# Patient Record
Sex: Female | Born: 1980 | Race: White | Hispanic: No | Marital: Married | Smoking: Never smoker
Health system: Southern US, Community
[De-identification: ages and names within clinical notes are randomized; demographics above are authoritative.]

## PROBLEM LIST (undated history)

## (undated) ENCOUNTER — Inpatient Hospital Stay (HOSPITAL_COMMUNITY): Payer: Self-pay

## (undated) DIAGNOSIS — O2243 Hemorrhoids in pregnancy, third trimester: Secondary | ICD-10-CM

## (undated) DIAGNOSIS — Z789 Other specified health status: Secondary | ICD-10-CM

## (undated) HISTORY — PX: NO PAST SURGERIES: SHX2092

## (undated) HISTORY — DX: Hemorrhoids in pregnancy, third trimester: O22.43

---

## 2015-12-23 ENCOUNTER — Inpatient Hospital Stay (HOSPITAL_COMMUNITY)
Admission: AD | Admit: 2015-12-23 | Discharge: 2015-12-23 | Disposition: A | Discharge status: Home / Self Care | Payer: Medicaid Other | Source: Ambulatory Visit | Attending: Obstetrics and Gynecology | Admitting: Obstetrics and Gynecology

## 2015-12-23 ENCOUNTER — Encounter (HOSPITAL_COMMUNITY): Payer: Self-pay | Admitting: *Deleted

## 2015-12-23 DIAGNOSIS — R109 Unspecified abdominal pain: Secondary | ICD-10-CM | POA: Diagnosis not present

## 2015-12-23 DIAGNOSIS — Z3A13 13 weeks gestation of pregnancy: Secondary | ICD-10-CM | POA: Diagnosis not present

## 2015-12-23 DIAGNOSIS — O219 Vomiting of pregnancy, unspecified: Secondary | ICD-10-CM

## 2015-12-23 DIAGNOSIS — O210 Mild hyperemesis gravidarum: Secondary | ICD-10-CM | POA: Insufficient documentation

## 2015-12-23 DIAGNOSIS — O9989 Other specified diseases and conditions complicating pregnancy, childbirth and the puerperium: Secondary | ICD-10-CM | POA: Diagnosis not present

## 2015-12-23 HISTORY — DX: Other specified health status: Z78.9

## 2015-12-23 LAB — COMPREHENSIVE METABOLIC PANEL
ALT: 20 U/L (ref 14–54)
ANION GAP: 5 (ref 5–15)
AST: 29 U/L (ref 15–41)
Albumin: 3.5 g/dL (ref 3.5–5.0)
Alkaline Phosphatase: 31 U/L — ABNORMAL LOW (ref 38–126)
BILIRUBIN TOTAL: 0.9 mg/dL (ref 0.3–1.2)
BUN: 7 mg/dL (ref 6–20)
CALCIUM: 9.4 mg/dL (ref 8.9–10.3)
CO2: 22 mmol/L (ref 22–32)
Chloride: 107 mmol/L (ref 101–111)
Creatinine, Ser: 0.44 mg/dL (ref 0.44–1.00)
GFR calc Af Amer: >60 mL/min (ref >60)
Glucose, Bld: 91 mg/dL (ref 65–99)
POTASSIUM: 5 mmol/L (ref 3.5–5.1)
Sodium: 134 mmol/L — ABNORMAL LOW (ref 135–145)
TOTAL PROTEIN: 6.6 g/dL (ref 6.5–8.1)

## 2015-12-23 LAB — URINALYSIS, ROUTINE W REFLEX MICROSCOPIC
Bilirubin Urine: NEGATIVE
Glucose, UA: NEGATIVE mg/dL
Ketones, ur: NEGATIVE mg/dL
LEUKOCYTES UA: NEGATIVE
NITRITE: NEGATIVE
PROTEIN: NEGATIVE mg/dL
Specific Gravity, Urine: 1.025 (ref 1.005–1.030)
pH: 6 (ref 5.0–8.0)

## 2015-12-23 LAB — CBC WITH DIFFERENTIAL/PLATELET
BASOS ABS: 0 10*3/uL (ref 0.0–0.1)
BASOS PCT: 0 %
EOS PCT: 0 %
Eosinophils Absolute: 0 10*3/uL (ref 0.0–0.7)
HCT: 31.4 % — ABNORMAL LOW (ref 36.0–46.0)
Hemoglobin: 10.9 g/dL — ABNORMAL LOW (ref 12.0–15.0)
LYMPHS PCT: 23 %
Lymphs Abs: 1.5 10*3/uL (ref 0.7–4.0)
MCH: 29.5 pg (ref 26.0–34.0)
MCHC: 34.7 g/dL (ref 30.0–36.0)
MCV: 85.1 fL (ref 78.0–100.0)
Monocytes Absolute: 0.3 10*3/uL (ref 0.1–1.0)
Monocytes Relative: 4 %
NEUTROS ABS: 4.7 10*3/uL (ref 1.7–7.7)
Neutrophils Relative %: 73 %
PLATELETS: 179 10*3/uL (ref 150–400)
RBC: 3.69 MIL/uL — AB (ref 3.87–5.11)
RDW: 12.4 % (ref 11.5–15.5)
WBC: 6.5 10*3/uL (ref 4.0–10.5)

## 2015-12-23 LAB — URINE MICROSCOPIC-ADD ON: BACTERIA UA: NONE SEEN

## 2015-12-23 MED ORDER — LACTATED RINGERS IV BOLUS (SEPSIS)
1000.0000 mL | Freq: Once | INTRAVENOUS | Status: AC
  Administered 2015-12-23: 1000 mL via INTRAVENOUS

## 2015-12-23 MED ORDER — ONDANSETRON 4 MG PO TBDP: 4.0000 mg | ORAL_TABLET | Freq: Four times a day (QID) | ORAL | 1 refills | Status: DC | PRN

## 2015-12-23 MED ORDER — PROMETHAZINE HCL 25 MG/ML IJ SOLN
12.5000 mg | Freq: Once | INTRAMUSCULAR | Status: AC
  Administered 2015-12-23: 12.5 mg via INTRAVENOUS
  Filled 2015-12-23: qty 1

## 2015-12-23 NOTE — Discharge Instructions (Signed)

## 2015-12-23 NOTE — MAU Note (Signed)
Pt C/O vomiting since beginning of pregnancy, is worse now.  Also LLQ pain for the last 3 days, denies bleeding.  No diarrhea.  Has HA.  Is traveling from [place].

## 2015-12-23 NOTE — MAU Provider Note (Signed)
  History     CSN: [id number]  Arrival date and time: 12/23/15 1109   First Provider Initiated Contact with Patient 12/23/15 1147      Chief Complaint  Patient presents with  . Emesis During Pregnancy  . Abdominal Pain   HPI 35 yo G2P0010 IUP 13 5/7 weeks presents to MAU with c/o N/V. Just moved to [place] from [place]. Has not started OB care here. IUP was confirmed in [place]. N/V has been a problem with this pregnancy. Had some Zofran which helped but Rx ran out about 1 week ago.   She denies any VB or LOF.   Denies any chronic medical problems.    Past Medical History:  Diagnosis Date  . Medical history non-contributory     Past Surgical History:  Procedure Laterality Date  . NO PAST SURGERIES      History reviewed. No pertinent family history.  Social History  Substance Use Topics  . Smoking status: Never Smoker  . Smokeless tobacco: Never Used  . Alcohol use No    Allergies: No Known Allergies  No prescriptions prior to admission.    Review of Systems  Constitutional: Negative.   Respiratory: Negative.   Cardiovascular: Negative.   Gastrointestinal: Positive for heartburn, nausea and vomiting.  Genitourinary: Negative.    Physical Exam   Blood pressure 117/67, pulse 91, temperature 98.1 F (36.7 C), temperature source Oral, resp. rate 16, height 5\' 4"  (1.626 m), weight 144 lb (65.3 kg), last menstrual period 09/18/2015.  Physical Exam WDWN female in NAD Lungs clear Heart RRR Abd soft + BS GU deferred Ext non tender MAU Course  Procedures IV fluids. + FHT by doppler   Assessment and Plan  IUP 13 5/7 weeks N/V  Pt was given IV fluids and antiemetics. Labs reviewed. Given po fluids and crackers. Tolerated. Felt amendable for d/c home. Rx for Zofran provided. Diet modification reviewed with pt. List of OB providers given to pt. Instructed to obtain OB care and return to MAU if Sx returned or for any concerns.  Hermina StaggersMichael L Brode Sculley 12/23/2015, 1:28  PM

## 2016-01-01 ENCOUNTER — Other Ambulatory Visit (HOSPITAL_COMMUNITY)
Admission: RE | Admit: 2016-01-01 | Discharge: 2016-01-01 | Disposition: A | Discharge status: Home / Self Care | Payer: Medicaid Other | Source: Ambulatory Visit | Attending: Family | Admitting: Family

## 2016-01-01 ENCOUNTER — Ambulatory Visit (INDEPENDENT_AMBULATORY_CARE_PROVIDER_SITE_OTHER): Payer: Medicaid Other | Admitting: Family

## 2016-01-01 ENCOUNTER — Encounter: Payer: Self-pay | Admitting: Family

## 2016-01-01 VITALS — BP 109/64 | HR 90 | Wt 146.0 lb

## 2016-01-01 DIAGNOSIS — Z113 Encounter for screening for infections with a predominantly sexual mode of transmission: Secondary | ICD-10-CM | POA: Insufficient documentation

## 2016-01-01 DIAGNOSIS — Z124 Encounter for screening for malignant neoplasm of cervix: Secondary | ICD-10-CM | POA: Diagnosis not present

## 2016-01-01 DIAGNOSIS — Z1151 Encounter for screening for human papillomavirus (HPV): Secondary | ICD-10-CM | POA: Diagnosis not present

## 2016-01-01 DIAGNOSIS — Z3491 Encounter for supervision of normal pregnancy, unspecified, first trimester: Secondary | ICD-10-CM

## 2016-01-01 DIAGNOSIS — Z01419 Encounter for gynecological examination (general) (routine) without abnormal findings: Secondary | ICD-10-CM | POA: Diagnosis present

## 2016-01-01 DIAGNOSIS — Z3480 Encounter for supervision of other normal pregnancy, unspecified trimester: Secondary | ICD-10-CM

## 2016-01-01 DIAGNOSIS — Z3482 Encounter for supervision of other normal pregnancy, second trimester: Secondary | ICD-10-CM | POA: Diagnosis not present

## 2016-01-01 DIAGNOSIS — Z3490 Encounter for supervision of normal pregnancy, unspecified, unspecified trimester: Secondary | ICD-10-CM | POA: Insufficient documentation

## 2016-01-01 LAB — HEMOGLOBIN A1C
Hgb A1c MFr Bld: 5 % (ref <5.7)
MEAN PLASMA GLUCOSE: 97 mg/dL

## 2016-01-01 LAB — GLUCOSE, RANDOM: GLUCOSE: 81 mg/dL (ref 65–99)

## 2016-01-01 MED ORDER — CONCEPT DHA 53.5-38-1 MG PO CAPS: 1.0000 | ORAL_CAPSULE | Freq: Every day | ORAL | 2 refills | Status: DC

## 2016-01-01 NOTE — Patient Instructions (Signed)
Second Trimester of Pregnancy The second trimester is from week 13 through week 28 (months 4 through 6). The second trimester is often a time when you feel your best. Your body has also adjusted to being pregnant, and you begin to feel better physically. Usually, morning sickness has lessened or quit completely, you may have more energy, and you may have an increase in appetite. The second trimester is also a time when the fetus is growing rapidly. At the end of the sixth month, the fetus is about 9 inches long and weighs about 1 pounds. You will likely begin to feel the baby move (quickening) between 18 and 20 weeks of the pregnancy. Body changes during your second trimester Your body continues to go through many changes during your second trimester. The changes vary from woman to woman.  Your weight will continue to increase. You will notice your lower abdomen bulging out.  You may begin to get stretch marks on your hips, abdomen, and breasts.  You may develop headaches that can be relieved by medicines. The medicines should be approved by your health care provider.  You may urinate more often because the fetus is pressing on your bladder.  You may develop or continue to have heartburn as a result of your pregnancy.  You may develop constipation because certain hormones are causing the muscles that push waste through your intestines to slow down.  You may develop hemorrhoids or swollen, bulging veins (varicose veins).  You may have back pain. This is caused by:  Weight gain.  Pregnancy hormones that are relaxing the joints in your pelvis.  A shift in weight and the muscles that support your balance.  Your breasts will continue to grow and they will continue to become tender.  Your gums may bleed and may be sensitive to brushing and flossing.  Dark spots or blotches (chloasma, mask of pregnancy) may develop on your face. This will likely fade after the baby is born.  A dark line  from your belly button to the pubic area (linea nigra) may appear. This will likely fade after the baby is born.  You may have changes in your hair. These can include thickening of your hair, rapid growth, and changes in texture. Some women also have hair loss during or after pregnancy, or hair that feels dry or thin. Your hair will most likely return to normal after your baby is born. What to expect at prenatal visits During a routine prenatal visit:  You will be weighed to make sure you and the fetus are growing normally.  Your blood pressure will be taken.  Your abdomen will be measured to track your baby's growth.  The fetal heartbeat will be listened to.  Any test results from the previous visit will be discussed. Your health care provider may ask you:  How you are feeling.  If you are feeling the baby move.  If you have had any abnormal symptoms, such as leaking fluid, bleeding, severe headaches, or abdominal cramping.  If you are using any tobacco products, including cigarettes, chewing tobacco, and electronic cigarettes.  If you have any questions. Other tests that may be performed during your second trimester include:  Blood tests that check for:  Low iron levels (anemia).  Gestational diabetes (between 24 and 28 weeks).  Rh antibodies. This is to check for a protein on red blood cells (Rh factor).  Urine tests to check for infections, diabetes, or protein in the urine.  An ultrasound to   confirm the proper growth and development of the baby.  An amniocentesis to check for possible genetic problems.  Fetal screens for spina bifida and Down syndrome.  HIV (human immunodeficiency virus) testing. Routine prenatal testing includes screening for HIV, unless you choose not to have this test. Follow these instructions at home: Eating and drinking  Continue to eat regular, healthy meals.  Avoid raw meat, uncooked cheese, cat litter boxes, and soil used by cats. These  carry germs that can cause birth defects in the baby.  Take your prenatal vitamins.  Take 1500-2000 mg of calcium daily starting at the 20th week of pregnancy until you deliver your baby.  If you develop constipation:  Take over-the-counter or prescription medicines.  Drink enough fluid to keep your urine clear or pale yellow.  Eat foods that are high in fiber, such as fresh fruits and vegetables, whole grains, and beans.  Limit foods that are high in fat and processed sugars, such as fried and sweet foods. Activity  Exercise only as directed by your health care provider. Experiencing uterine cramps is a good sign to stop exercising.  Avoid heavy lifting, wear low heel shoes, and practice good posture.  Wear your seat belt at all times when driving.  Rest with your legs elevated if you have leg cramps or low back pain.  Wear a good support bra for breast tenderness.  Do not use hot tubs, steam rooms, or saunas. Lifestyle  Avoid all smoking, herbs, alcohol, and unprescribed drugs. These chemicals affect the formation and growth of the baby.  Do not use any products that contain nicotine or tobacco, such as cigarettes and e-cigarettes. If you need help quitting, ask your health care provider.  A sexual relationship may be continued unless your health care provider directs you otherwise. General instructions  Follow your health care provider's instructions regarding medicine use. There are medicines that are either safe or unsafe to take during pregnancy.  Take warm sitz baths to soothe any pain or discomfort caused by hemorrhoids. Use hemorrhoid cream if your health care provider approves.  If you develop varicose veins, wear support hose. Elevate your feet for 15 minutes, 3-4 times a day. Limit salt in your diet.  Visit your dentist if you have not gone yet during your pregnancy. Use a soft toothbrush to brush your teeth and be gentle when you floss.  Keep all follow-up  prenatal visits as told by your health care provider. This is important. Contact a health care provider if:  You have dizziness.  You have mild pelvic cramps, pelvic pressure, or nagging pain in the abdominal area.  You have persistent nausea, vomiting, or diarrhea.  You have a bad smelling vaginal discharge.  You have pain with urination. Get help right away if:  You have a fever.  You are leaking fluid from your vagina.  You have spotting or bleeding from your vagina.  You have severe abdominal cramping or pain.  You have rapid weight gain or weight loss.  You have shortness of breath with chest pain.  You notice sudden or extreme swelling of your face, hands, ankles, feet, or legs.  You have not felt your baby move in over an hour.  You have severe headaches that do not go away with medicine.  You have vision changes. Summary  The second trimester is from week 13 through week 28 (months 4 through 6). It is also a time when the fetus is growing rapidly.  Your body goes   through many changes during pregnancy. The changes vary from woman to woman.  Avoid all smoking, herbs, alcohol, and unprescribed drugs. These chemicals affect the formation and growth your baby.  Do not use any tobacco products, such as cigarettes, chewing tobacco, and e-cigarettes. If you need help quitting, ask your health care provider.  Contact your health care provider if you have any questions. Keep all prenatal visits as told by your health care provider. This is important. This information is not intended to replace advice given to you by your health care provider. Make sure you discuss any questions you have with your health care provider. Document Released: 01/25/2001 Document Revised: 07/09/2015 Document Reviewed: 04/03/2012 Elsevier Interactive Patient Education  2017 [employer]  

## 2016-01-01 NOTE — Progress Notes (Signed)
  Subjective:    Carrie Durham is a G2P0010 910w0d being seen today for her first obstetrical visit.  Her obstetrical history is significant for first trimester miscarriage, uncomplicated. Here with husband.  Patient does intend to breast feed. Pregnancy history fully reviewed.  Patient reports fatigue and nausea.  Vitals:   01/01/16 1001  BP: 109/64  Pulse: 90  Weight: 146 lb (66.2 kg)    HISTORY: OB History  Gravida Para Term Preterm AB Living  2       1    SAB TAB Ectopic Multiple Live Births               # Outcome Date GA Lbr Len/2nd Weight Sex Delivery Anes PTL Lv  2 Current           1 AB              Past Medical History:  Diagnosis Date  . Medical history non-contributory    Past Surgical History:  Procedure Laterality Date  . NO PAST SURGERIES     History reviewed. No pertinent family history.   Exam   BP 109/64   Pulse 90   Wt 146 lb (66.2 kg)   LMP 09/18/2015   BMI 25.06 kg/m  Uterine Size: size equals dates  Pelvic Exam:    Perineum: No Hemorrhoids, Normal Perineum   Vulva: normal   Vagina:  normal mucosa, normal discharge, no palpable nodules   pH: Not done   Cervix: no bleeding following Pap, no cervical motion tenderness and no lesions   Adnexa: normal adnexa and no mass, fullness, tenderness   Bony Pelvis: Adequate  System: Breast:  No nipple retraction or dimpling, No nipple discharge or bleeding, No axillary or supraclavicular adenopathy, Normal to palpation without dominant masses   Skin: normal coloration and turgor, no rashes    Neurologic: negative   Extremities: normal strength, tone, and muscle mass   HEENT neck supple with midline trachea and thyroid without masses   Mouth/Teeth mucous membranes moist, pharynx normal without lesions   Neck supple and no masses   Cardiovascular: regular rate and rhythm, no murmurs or gallops   Respiratory:  appears well, vitals normal, no respiratory distress, acyanotic, normal RR, neck free of  mass or lymphadenopathy, chest clear, no wheezing, crepitations, rhonchi, normal symmetric air entry   Abdomen: soft, non-tender; bowel sounds normal; no masses,  no organomegaly   Urinary: urethral meatus normal      Assessment:    Pregnancy: G2P0010 Patient Active Problem List   Diagnosis Date Noted  . Supervision of normal pregnancy, antepartum 01/01/2016        Plan:     Initial labs drawn.  Pap smear collected Prenatal vitamins. Problem list reviewed and updated. Genetic Screening discussed Quad Screen: will obtain at next visit.  Ultrasound discussed; fetal survey: order at next visit.  Follow up in 4 weeks.  Carrie Durham, Carrie Durham 01/01/2016

## 2016-01-02 LAB — CULTURE, OB URINE

## 2016-01-02 LAB — SICKLE CELL SCREEN: SICKLE CELL SCREEN: NEGATIVE

## 2016-01-04 LAB — PRENATAL PROFILE (SOLSTAS)
Antibody Screen: NEGATIVE
BASOS PCT: 0 %
Basophils Absolute: 0 cells/uL (ref 0–200)
EOS PCT: 1 %
Eosinophils Absolute: 71 cells/uL (ref 15–500)
HEMATOCRIT: 32.7 % — AB (ref 35.0–45.0)
HEMOGLOBIN: 10.7 g/dL — AB (ref 11.7–15.5)
HEP B S AG: NEGATIVE
HIV: NONREACTIVE
LYMPHS ABS: 1491 {cells}/uL (ref 850–3900)
LYMPHS PCT: 21 %
MCH: 29.6 pg (ref 27.0–33.0)
MCHC: 32.7 g/dL (ref 32.0–36.0)
MCV: 90.6 fL (ref 80.0–100.0)
MONO ABS: 497 {cells}/uL (ref 200–950)
MPV: 10.8 fL (ref 7.5–12.5)
Monocytes Relative: 7 %
NEUTROS PCT: 71 %
Neutro Abs: 5041 cells/uL (ref 1500–7800)
Platelets: 183 10*3/uL (ref 140–400)
RBC: 3.61 MIL/uL — AB (ref 3.80–5.10)
RDW: 13.4 % (ref 11.0–15.0)
RH TYPE: POSITIVE
Rubella: 21.4 Index — ABNORMAL HIGH (ref <0.90)
WBC: 7.1 10*3/uL (ref 3.8–10.8)

## 2016-01-04 LAB — CYTOLOGY - PAP
Diagnosis: NEGATIVE
HPV: NOT DETECTED

## 2016-01-04 LAB — URINE CYTOLOGY ANCILLARY ONLY
Chlamydia: NEGATIVE
Neisseria Gonorrhea: NEGATIVE

## 2016-01-05 ENCOUNTER — Emergency Department (HOSPITAL_COMMUNITY)
Admission: EM | Admit: 2016-01-05 | Discharge: 2016-01-05 | Disposition: A | Discharge status: Home / Self Care | Payer: Medicaid Other | Attending: Emergency Medicine | Admitting: Emergency Medicine

## 2016-01-05 ENCOUNTER — Encounter (HOSPITAL_COMMUNITY): Payer: Self-pay | Admitting: *Deleted

## 2016-01-05 DIAGNOSIS — Y939 Activity, unspecified: Secondary | ICD-10-CM | POA: Insufficient documentation

## 2016-01-05 DIAGNOSIS — M545 Low back pain, unspecified: Secondary | ICD-10-CM

## 2016-01-05 DIAGNOSIS — Y92410 Unspecified street and highway as the place of occurrence of the external cause: Secondary | ICD-10-CM | POA: Diagnosis not present

## 2016-01-05 DIAGNOSIS — Y999 Unspecified external cause status: Secondary | ICD-10-CM | POA: Insufficient documentation

## 2016-01-05 DIAGNOSIS — R1032 Left lower quadrant pain: Secondary | ICD-10-CM | POA: Insufficient documentation

## 2016-01-05 DIAGNOSIS — O9A212 Injury, poisoning and certain other consequences of external causes complicating pregnancy, second trimester: Secondary | ICD-10-CM | POA: Diagnosis present

## 2016-01-05 DIAGNOSIS — Z3A15 15 weeks gestation of pregnancy: Secondary | ICD-10-CM | POA: Insufficient documentation

## 2016-01-05 DIAGNOSIS — Z3492 Encounter for supervision of normal pregnancy, unspecified, second trimester: Secondary | ICD-10-CM

## 2016-01-05 LAB — PAIN MGMT, PROFILE 6 CONF W/O MM, U
6 ACETYLMORPHINE: NEGATIVE ng/mL (ref <10)
ALCOHOL METABOLITES: NEGATIVE ng/mL (ref <500)
Amphetamines: NEGATIVE ng/mL (ref <500)
BARBITURATES: NEGATIVE ng/mL (ref <300)
Benzodiazepines: NEGATIVE ng/mL (ref <100)
COCAINE METABOLITE: NEGATIVE ng/mL (ref <150)
Creatinine: 210.8 mg/dL (ref >=20.0)
METHADONE METABOLITE: NEGATIVE ng/mL (ref <100)
Marijuana Metabolite: NEGATIVE ng/mL (ref <20)
OXYCODONE: NEGATIVE ng/mL (ref <100)
Opiates: NEGATIVE ng/mL (ref <100)
Oxidant: NEGATIVE ug/mL (ref <200)
PH: 6 (ref 4.5–9.0)
PHENCYCLIDINE: NEGATIVE ng/mL (ref <25)
PLEASE NOTE: 0

## 2016-01-05 LAB — URINALYSIS, ROUTINE W REFLEX MICROSCOPIC
Bilirubin Urine: NEGATIVE
GLUCOSE, UA: NEGATIVE mg/dL
KETONES UR: 15 mg/dL — AB
Nitrite: NEGATIVE
PROTEIN: NEGATIVE mg/dL
Specific Gravity, Urine: 1.009 (ref 1.005–1.030)
pH: 5.5 (ref 5.0–8.0)

## 2016-01-05 LAB — URINE MICROSCOPIC-ADD ON

## 2016-01-05 NOTE — ED Notes (Signed)
Doppler used and FHR found in LLQ of abd, FHR 145-147.

## 2016-01-05 NOTE — ED Notes (Signed)
Notified MD and PA of patient's condition and pregnancy.

## 2016-01-05 NOTE — Discharge Instructions (Signed)
Apply ice to areas that hurt. Take acetaminophen (Tylenol) as needed for pain.
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
## 2016-01-06 LAB — CYSTIC FIBROSIS DIAGNOSTIC STUDY

## 2016-01-19 ENCOUNTER — Encounter (HOSPITAL_COMMUNITY): Payer: Self-pay | Admitting: Family

## 2016-01-29 ENCOUNTER — Ambulatory Visit (HOSPITAL_COMMUNITY)
Admission: RE | Admit: 2016-01-29 | Discharge: 2016-01-29 | Disposition: A | Discharge status: Home / Self Care | Payer: Medicaid Other | Source: Ambulatory Visit | Attending: Family | Admitting: Family

## 2016-01-29 DIAGNOSIS — Z3A19 19 weeks gestation of pregnancy: Secondary | ICD-10-CM | POA: Diagnosis not present

## 2016-01-29 DIAGNOSIS — Z3480 Encounter for supervision of other normal pregnancy, unspecified trimester: Secondary | ICD-10-CM

## 2016-01-29 DIAGNOSIS — Z3482 Encounter for supervision of other normal pregnancy, second trimester: Secondary | ICD-10-CM | POA: Diagnosis present

## 2016-02-05 ENCOUNTER — Encounter: Payer: Self-pay | Admitting: Family

## 2016-02-05 ENCOUNTER — Ambulatory Visit (INDEPENDENT_AMBULATORY_CARE_PROVIDER_SITE_OTHER): Payer: Medicaid Other | Admitting: Family

## 2016-02-05 DIAGNOSIS — Z3482 Encounter for supervision of other normal pregnancy, second trimester: Secondary | ICD-10-CM

## 2016-02-05 DIAGNOSIS — Z3480 Encounter for supervision of other normal pregnancy, unspecified trimester: Secondary | ICD-10-CM
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
## 2016-02-10 LAB — AFP, QUAD SCREEN
AFP: 66.5 ng/mL
Age Alone: 1:402 {titer}
CURR GEST AGE: 20 wk
HCG, Total: 38.9 IU/mL
INH: 287.4 pg/mL
Interpretation-AFP: NEGATIVE
MOM FOR INH: 2.01
MoM for AFP: 1.22
MoM for hCG: 2.15
Open Spina bifida: NEGATIVE
Osb Risk: 1:6430 {titer}
TRI 18 SCR RISK EST: NEGATIVE
Trisomy 18 (Edward) Syndrome Interp.: 1:44300 {titer}
UE3 VALUE: 1.84 ng/mL
uE3 Mom: 0.89

## 2016-02-15 NOTE — L&D Delivery Note (Signed)
Indication for operative vaginal delivery: Maternal exhaustion and poor maternal pushing  Risks of vacuum assistance were discussed in detail, including but not limited to, bleeding, infection, damage to maternal tissues, fetal cephalohematoma, inability to effect vaginal delivery of the head or shoulder dystocia that cannot be resolved by established maneuvers and need for emergency cesarean section.  Patient gave verbal consent.  Patient was examined and found to be fully dilated with fetal station of +2. The soft vacuum soft cup was positioned over the sagittal suture 3 cm anterior to posterior fontanelle.  Pressure was then increased to 500 mmHg, and the patient was instructed to push.  Pulling was administered along the pelvic curve.  6 pulls was administered during 6 pushs, 1 popoffs.  The infant was then delivered atraumatically, noted to be a viable female infant, Apgars of 1,8 and 9, weight was 9 pounds 15 ounces.  Nuchal cord x 1 was noted and reduced, during reduction cord pop. Cord was clamped and infant was passed to NICU . There was spontaneous placental delivery, intact with three-vessel cord. Third degree laceration and cervical laceration was noted. Third degree laceration was repaired in the usual fashion with 2/0 and 3/0 Vicryl.  Unable to repair cervical laceration due to inadequate visualization.  [id number], epidural anesthesia.   Sponge, instrument and needle counts were correct x2.  Necessity to complete repair in OR was reviewed with FOB and pt. They verbalized understanding. See OP note for additional information regards to cervical repair.  The father and baby remained in the room. Neonatology present for delivery.   Nettie ElmMichael Mayzie Caughlin, MD, FACOG Attending Obstetrician & Gynecologist Faculty Practice, [facility]

## 2016-03-03 ENCOUNTER — Inpatient Hospital Stay (HOSPITAL_COMMUNITY)
Admission: AD | Admit: 2016-03-03 | Discharge: 2016-03-03 | Disposition: A | Discharge status: Home / Self Care | Payer: Medicaid Other | Source: Ambulatory Visit | Attending: Obstetrics & Gynecology | Admitting: Obstetrics & Gynecology

## 2016-03-03 ENCOUNTER — Encounter (HOSPITAL_COMMUNITY): Payer: Self-pay

## 2016-03-03 DIAGNOSIS — Z3A23 23 weeks gestation of pregnancy: Secondary | ICD-10-CM | POA: Insufficient documentation

## 2016-03-03 DIAGNOSIS — R109 Unspecified abdominal pain: Secondary | ICD-10-CM

## 2016-03-03 DIAGNOSIS — O4692 Antepartum hemorrhage, unspecified, second trimester: Secondary | ICD-10-CM | POA: Diagnosis present

## 2016-03-03 DIAGNOSIS — N3001 Acute cystitis with hematuria: Secondary | ICD-10-CM

## 2016-03-03 DIAGNOSIS — O2312 Infections of bladder in pregnancy, second trimester: Secondary | ICD-10-CM | POA: Diagnosis not present

## 2016-03-03 DIAGNOSIS — O26893 Other specified pregnancy related conditions, third trimester: Secondary | ICD-10-CM

## 2016-03-03 DIAGNOSIS — N390 Urinary tract infection, site not specified: Secondary | ICD-10-CM

## 2016-03-03 LAB — URINALYSIS, ROUTINE W REFLEX MICROSCOPIC
Bilirubin Urine: NEGATIVE
Glucose, UA: NEGATIVE mg/dL
Ketones, ur: NEGATIVE mg/dL
Nitrite: NEGATIVE
PH: 5 (ref 5.0–8.0)
Protein, ur: NEGATIVE mg/dL
SPECIFIC GRAVITY, URINE: 1.014 (ref 1.005–1.030)

## 2016-03-03 NOTE — MAU Note (Signed)
Pt reports she had some bleeding when she wiped and vomited x2 today. Good fetal movement. Also reports some mild cramping and pain with urination. Intercourse last night

## 2016-03-03 NOTE — Discharge Instructions (Signed)
Abdominal Pain During Pregnancy Belly (abdominal) pain is common during pregnancy. Most of the time, it is not a serious problem. Other times, it can be a sign that something is wrong with the pregnancy. Always tell your doctor if you have belly pain. Follow these instructions at home: Monitor your belly pain for any changes. The following actions may help you feel better:  Do not have sex (intercourse) or put anything in your vagina until you feel better.  Rest until your pain stops.  Drink clear fluids if you feel sick to your stomach (nauseous). Do not eat solid food until you feel better.  Only take medicine as told by your doctor.  Keep all doctor visits as told. Get help right away if:  You are bleeding, leaking fluid, or pieces of tissue come out of your vagina.  You have more pain or cramping.  You keep throwing up (vomiting).  You have pain when you pee (urinate) or have blood in your pee.  You have a fever.  You do not feel your baby moving as much.  You feel very weak or feel like passing out.  You have trouble breathing, with or without belly pain.  You have a very bad headache and belly pain.  You have fluid leaking from your vagina and belly pain.  You keep having watery poop (diarrhea).  Your belly pain does not go away after resting, or the pain gets worse. This information is not intended to replace advice given to you by your health care provider. Make sure you discuss any questions you have with your health care provider. Document Released: 01/19/2009 Document Revised: 09/09/2015 Document Reviewed: 08/30/2012 Elsevier Interactive Patient Education  2017 [employer]  Pregnancy and Urinary Tract Infection WHAT IS A URINARY TRACT INFECTION? A urinary tract infection (UTI) is an infection of any part of the urinary tract. This includes the kidneys, the tubes that connect your kidneys to your bladder (ureters), the bladder, and the tube that carries urine  out of your body (urethra). These organs make, store, and get rid of urine in the body. A UTI can be a bladder infection (cystitis) or a kidney infection (pyelonephritis). This infection may be caused by fungi, viruses, and bacteria. Bacteria are the most common cause of UTIs. You are more likely to develop a UTI during pregnancy because:  The physical and hormonal changes your body goes through can make it easier for bacteria to get into your urinary tract.  Your growing baby puts pressure on your uterus and can affect urine flow. DOES A UTI PLACE MY BABY AT RISK? An untreated UTI during pregnancy could lead to a kidney infection, which can cause health problems that could affect your baby. Possible complications of an untreated UTI include:  Having your baby before 37 weeks of pregnancy (premature).  Having a baby with a low birth weight.  Developing high blood pressure during pregnancy (preeclampsia). WHAT ARE THE SYMPTOMS OF A UTI? Symptoms of a UTI include:  Fever.  Frequent urination or passing small amounts of urine frequently.  Needing to urinate urgently.  Pain or a burning sensation with urination.  Urine that smells bad or unusual.  Cloudy urine.  Pain in the lower abdomen or back.  Trouble urinating.  Blood in the urine.  Vomiting or being less hungry than normal.  Diarrhea or abdominal pain.  Vaginal discharge. WHAT ARE THE TREATMENT OPTIONS FOR A UTI DURING PREGNANCY? Treatment for this condition may include:  Antibiotic medicines  that are safe to take during pregnancy.  Other medicines to treat less common causes of UTI. HOW CAN I PREVENT A UTI? To prevent a UTI:  Go to the bathroom as soon as you feel the need.  Always wipe from front to back.  Wash your genital area with soap and warm water daily.  Empty your bladder before and after sex.  Wear cotton underwear.  Limit your intake of high sugar foods or drinks, such as regular soda, juice,  and sweets..  Drink 6-8 glasses of water daily.  Do not wear tight-fitting pants.  Do not douche or use deodorant sprays.  Do not drink alcohol, caffeine, or carbonated drinks. These can irritate the bladder. WHEN SHOULD I SEEK MEDICAL CARE? Seek medical care if:  Your symptoms do not improve or get worse.  You have a fever after two days of treatment.  You have a rash.  You have abnormal vaginal discharge.  You have back or side pain.  You have chills.  You have nausea and vomiting. WHEN SHOULD I SEEK IMMEDIATE MEDICAL CARE? Seek immediate medical care if you are pregnant and:  You feel contractions in your uterus.  You have lower belly pain.  You have a gush of fluid from your vagina.  You have blood in your urine.  You are vomiting and cannot keep down any medicines or water. This information is not intended to replace advice given to you by your health care provider. Make sure you discuss any questions you have with your health care provider. Document Released: 05/28/2010 Document Revised: 07/06/2015 Document Reviewed: 12/22/2014 Elsevier Interactive Patient Education  2017 [employer]

## 2016-03-03 NOTE — MAU Provider Note (Signed)
History     CSN: [id number]  Arrival date and time: 03/03/16 1427   First Provider Initiated Contact with Patient 03/03/16 1652      Chief Complaint  Patient presents with  . Vaginal Bleeding   HPI   Ms.Carrie Durham is a 36 y.o. female G2P0010 @ [redacted]w[redacted]d here in MAU with vaginal bleeding. She saw blood about 3 times after wiping. The blood was "soft", and bright red. The first time she saw it in her urine and the second time she saw it when she wiped.  Patient speaks french and prefers her husband to translate while her in MAU.   No recent intercourse.  When she went to the bathroom here she did not see blood.   OB History    Gravida Para Term Preterm AB Living   2       1     SAB TAB Ectopic Multiple Live Births                  Past Medical History:  Diagnosis Date  . Medical history non-contributory     Past Surgical History:  Procedure Laterality Date  . NO PAST SURGERIES      No family history on file.  Social History  Substance Use Topics  . Smoking status: Never Smoker  . Smokeless tobacco: Never Used  . Alcohol use No    Allergies: No Known Allergies  Prescriptions Prior to Admission  Medication Sig Dispense Refill Last Dose  . ondansetron (ZOFRAN) 24 MG tablet Take 24 mg by mouth once.   Not Taking  . Prenat-FeFum-FePo-FA-Omega 3 (CONCEPT DHA) 53.5-38-1 MG CAPS Take 1 tablet by mouth daily. (Patient not taking: Reported on 02/05/2016) 30 capsule 2 Not Taking   Results for orders placed or performed during the hospital encounter of 03/03/16 (from the past 48 hour(s))  Urinalysis, Routine w reflex microscopic     Status: Abnormal   Collection Time: 03/03/16  3:00 PM  Result Value Ref Range   Color, Urine YELLOW YELLOW   APPearance HAZY (A) CLEAR   Specific Gravity, Urine 1.014 1.005 - 1.030   pH 5.0 5.0 - 8.0   Glucose, UA NEGATIVE NEGATIVE mg/dL   Hgb urine dipstick SMALL (A) NEGATIVE   Bilirubin Urine NEGATIVE NEGATIVE   Ketones, ur  NEGATIVE NEGATIVE mg/dL   Protein, ur NEGATIVE NEGATIVE mg/dL   Nitrite NEGATIVE NEGATIVE   Leukocytes, UA TRACE (A) NEGATIVE   RBC / HPF 0-5 0 - 5 RBC/hpf   WBC, UA 0-5 0 - 5 WBC/hpf   Bacteria, UA RARE (A) NONE SEEN   Squamous Epithelial / LPF 6-30 (A) NONE SEEN   Mucous PRESENT     Review of Systems  Constitutional: Negative for chills and fever.  Gastrointestinal: Positive for abdominal pain (Lower abdominal pain). Negative for nausea and vomiting.  Genitourinary: Positive for dysuria and frequency.  Musculoskeletal: Positive for back pain.   Physical Exam   Blood pressure 114/58, pulse 82, temperature 99.2 F (37.3 C), temperature source Oral, resp. rate 18, height 5\' 4"  (1.626 m), weight 155 lb (70.3 kg), last menstrual period 09/18/2015.  Physical Exam  Constitutional: She appears well-developed and well-nourished. No distress.  HENT:  Head: Normocephalic.  Eyes: Pupils are equal, round, and reactive to light.  Neck: Neck supple.  GI: There is tenderness in the suprapubic area. There is no rigidity, no rebound, no guarding and no CVA tenderness.  Genitourinary:  Genitourinary Comments: Vagina - Small amount of white  vaginal discharge, no odor. No blood noted in the vault  Cervix - No contact bleeding, no active bleeding  Bimanual exam: Cervix: closed, thick posterior  Chaperone present for exam.   Skin: She is not diaphoretic.    MAU Course  Procedures  None  MDM  Urine culture pending   Assessment and Plan   A:  1. Abdominal pain in pregnancy, third trimester   2. Acute UTI   3. Acute cystitis with hematuria     P:  Discharge home in stable condition  Follow up with OB as scheduled or sooner if needed.  Return to MAU as needed, if symptoms worsen    Duane LopeJennifer I Dimond Crotty, NP 03/03/2016 5:32 PM

## 2016-03-04 ENCOUNTER — Ambulatory Visit (INDEPENDENT_AMBULATORY_CARE_PROVIDER_SITE_OTHER): Payer: Medicaid Other | Admitting: Family

## 2016-03-04 VITALS — BP 114/67 | HR 92 | Wt 154.0 lb

## 2016-03-04 DIAGNOSIS — O26892 Other specified pregnancy related conditions, second trimester: Secondary | ICD-10-CM

## 2016-03-04 DIAGNOSIS — K649 Unspecified hemorrhoids: Secondary | ICD-10-CM

## 2016-03-04 DIAGNOSIS — R300 Dysuria: Secondary | ICD-10-CM

## 2016-03-04 DIAGNOSIS — Z3480 Encounter for supervision of other normal pregnancy, unspecified trimester: Secondary | ICD-10-CM

## 2016-03-04 LAB — CULTURE, OB URINE
Culture: NO GROWTH
SPECIAL REQUESTS: NORMAL

## 2016-03-04 MED ORDER — HYDROCORTISONE ACE-PRAMOXINE 1-1 % RE FOAM: 1.0000 | Freq: Two times a day (BID) | RECTAL | 0 refills | Status: DC

## 2016-03-04 MED ORDER — NITROFURANTOIN MONOHYD MACRO 100 MG PO CAPS: 100.0000 mg | ORAL_CAPSULE | Freq: Two times a day (BID) | ORAL | 0 refills | Status: DC

## 2016-03-04 NOTE — Progress Notes (Addendum)
   PRENATAL VISIT NOTE  Subjective:  Carrie Durham is a 36 y.o. G2P0010 at 5856w0d being seen today for ongoing prenatal care.  She is currently monitored for the following issues for this low-risk pregnancy and has Supervision of normal pregnancy, antepartum on her problem list.  Patient reports dysuria x 1 week.  [facility], urine culture pending.  Contractions: Not present. Vag. Bleeding: Small.  Movement: Present. Denies leaking of fluid.   The following portions of the patient's history were reviewed and updated as appropriate: allergies, current medications, past family history, past medical history, past social history, past surgical history and problem list. Problem list updated.  Objective:   Vitals:   03/04/16 1026  BP: 114/67  Pulse: 92  Weight: 69.9 kg (154 lb)    Fetal Status: Fetal Heart Rate (bpm): 141 Fundal Height: 24 cm Movement: Present     General:  Alert, oriented and cooperative. Patient is in no acute distress.  Skin: Skin is warm and dry. No rash noted.   Cardiovascular: Normal heart rate noted  Respiratory: Normal respiratory effort, no problems with respiration noted  Abdomen: Soft, gravid, appropriate for gestational age. Pain/Pressure: Present     Pelvic:  Cervical exam deferred        Extremities: Normal range of motion.  Edema: None  Mental Status: Normal mood and affect. Normal behavior. Normal judgment and thought content.   Assessment and Plan:  Pregnancy: G2P0010 at 6356w0d  1. Supervision of other normal pregnancy, antepartum - Discussed third trimester labs for next visit  2. Dysuria during pregnancy, antepartum, second trimester - Urine culture pending - nitrofurantoin, macrocrystal-monohydrate, (MACROBID) 100 MG capsule; Take 1 capsule (100 mg total) by mouth 2 (two) times daily.  Dispense: 14 capsule; Refill: 0  3.  Hemorrhoids - Procotfoam  Preterm labor symptoms and general obstetric precautions including but not limited to  vaginal bleeding, contractions, leaking of fluid and fetal movement were reviewed in detail with the patient. Please refer to After Visit Summary for other counseling recommendations.  Return in about 4 weeks (around 04/01/2016).   Eino FarberWalidah Kennith GainN Karim, CNM

## 2016-03-04 NOTE — Progress Notes (Signed)
[facility]  ? UTI Culture is pending Mod -blood

## 2016-03-04 NOTE — Addendum Note (Signed)
Addended by: Marlis EdelsonKARIM, WALIDAH N on: 03/04/2016 11:15 AM   Modules accepted: Orders

## 2016-04-01 ENCOUNTER — Ambulatory Visit (INDEPENDENT_AMBULATORY_CARE_PROVIDER_SITE_OTHER): Payer: Medicaid Other | Admitting: Advanced Practice Midwife

## 2016-04-01 VITALS — BP 110/50 | HR 83 | Wt 157.0 lb

## 2016-04-01 DIAGNOSIS — M62830 Muscle spasm of back: Secondary | ICD-10-CM

## 2016-04-01 DIAGNOSIS — O9989 Other specified diseases and conditions complicating pregnancy, childbirth and the puerperium: Secondary | ICD-10-CM

## 2016-04-01 DIAGNOSIS — Z3A28 28 weeks gestation of pregnancy: Secondary | ICD-10-CM

## 2016-04-01 LAB — CBC
HEMATOCRIT: 27.7 % — AB (ref 35.0–45.0)
HEMOGLOBIN: 9.2 g/dL — AB (ref 11.7–15.5)
MCH: 30.2 pg (ref 27.0–33.0)
MCHC: 33.2 g/dL (ref 32.0–36.0)
MCV: 90.8 fL (ref 80.0–100.0)
MPV: 10.9 fL (ref 7.5–12.5)
Platelets: 174 10*3/uL (ref 140–400)
RBC: 3.05 MIL/uL — AB (ref 3.80–5.10)
RDW: 13.7 % (ref 11.0–15.0)
WBC: 9.1 10*3/uL (ref 3.8–10.8)

## 2016-04-01 MED ORDER — CYCLOBENZAPRINE HCL 5 MG PO TABS: 5.0000 mg | ORAL_TABLET | Freq: Every evening | ORAL | 0 refills | Status: DC | PRN

## 2016-04-01 NOTE — Patient Instructions (Signed)
Back Pain in Pregnancy Introduction Back pain during pregnancy is common. Back pain may be caused by several factors that are related to changes during your pregnancy. Follow these instructions at home: Managing pain, stiffness, and swelling  If directed, apply ice for sudden (acute) back pain.  Put ice in a plastic bag.  Place a towel between your skin and the bag.  Leave the ice on for 20 minutes, 2-3 times per day.  If directed, apply heat to the affected area before you exercise:  Place a towel between your skin and the heat pack or heating pad.  Leave the heat on for 20-30 minutes.  Remove the heat if your skin turns bright red. This is especially important if you are unable to feel pain, heat, or cold. You may have a greater risk of getting burned. Activity  Exercise as told by your health care provider. Exercising is the best way to prevent or manage back pain.  Listen to your body when lifting. If lifting hurts, ask for help or bend your knees. This uses your leg muscles instead of your back muscles.  Squat down when picking up something from the floor. Do not bend over.  Only use bed rest as told by your health care provider. Bed rest should only be used for the most severe episodes of back pain. Standing, Sitting, and Lying Down  Do not stand in one place for long periods of time.  Use good posture when sitting. Make sure your head rests over your shoulders and is not hanging forward. Use a pillow on your lower back if necessary.  Try sleeping on your side, preferably the left side, with a pillow or two between your legs. If you are sore after a night's rest, your bed may be too soft. A firm mattress may provide more support for your back during pregnancy. General instructions  Do not wear high heels.  Eat a healthy diet. Try to gain weight within your health care provider's recommendations.  Use a maternity girdle, elastic sling, or back brace as told by your  health care provider.  Take over-the-counter and prescription medicines only as told by your health care provider.  Keep all follow-up visits as told by your health care provider. This is important. This includes any visits with any specialists, such as a physical therapist. Contact a health care provider if:  Your back pain interferes with your daily activities.  You have increasing pain in other parts of your body. Get help right away if:  You develop numbness, tingling, weakness, or problems with the use of your arms or legs.  You develop severe back pain that is not controlled with medicine.  You have a sudden change in bowel or bladder control.  You develop shortness of breath, dizziness, or you faint.  You develop nausea, vomiting, or sweating.  You have back pain that is a rhythmic, cramping pain similar to labor pains. Labor pain is usually 1-2 minutes apart, lasts for about 1 minute, and involves a bearing down feeling or pressure in your pelvis.  You have back pain and your water breaks or you have vaginal bleeding.  You have back pain or numbness that travels down your leg.  Your back pain developed after you fell.  You develop pain on one side of your back.  You see blood in your urine.  You develop skin blisters in the area of your back pain. This information is not intended to replace advice given to   you by your health care provider. Make sure you discuss any questions you have with your health care provider. Document Released: 05/11/2005 Document Revised: 07/09/2015 Document Reviewed: 10/15/2014  2017 Elsevier  

## 2016-04-01 NOTE — Progress Notes (Signed)
   PRENATAL VISIT NOTE  Subjective:  Carrie Durham is a 36 y.o. G2P0010 at 4680w0d being seen today for ongoing prenatal care.  She is currently monitored for the following issues for this low-risk pregnancy and has Supervision of normal pregnancy, antepartum and Back spasm on her problem list.  Patient reports backache.  Contractions: Not present. Vag. Bleeding: None.  Movement: Present. Denies leaking of fluid.   The following portions of the patient's history were reviewed and updated as appropriate: allergies, current medications, past family history, past medical history, past social history, past surgical history and problem list. Problem list updated.  Objective:   Vitals:   04/01/16 1001  BP: (!) 110/50  Pulse: 83  Weight: 157 lb (71.2 kg)    Fetal Status: Fetal Heart Rate (bpm): 142   Movement: Present     General:  Alert, oriented and cooperative. Patient is in no acute distress.  Skin: Skin is warm and dry. No rash noted.   Cardiovascular: Normal heart rate noted  Respiratory: Normal respiratory effort, no problems with respiration noted  Abdomen: Soft, gravid, appropriate for gestational age. Pain/Pressure: Present     Pelvic:  Cervical exam deferred        Extremities: Normal range of motion.  Edema: None  Mental Status: Normal mood and affect. Normal behavior. Normal judgment and thought content.   Assessment and Plan:  Pregnancy: G2P0010 at 5780w0d  1. [redacted] weeks gestation of pregnancy  - Glucose Tolerance, 2 Hours w/1 Hour - CBC - RPR - HIV antibody (with reflex)  2. Back spasm     Discussed pain.       Rx Flexeril 5mg  prn HS for occasional PRN use      Comfort measures  Preterm labor symptoms and general obstetric precautions including but not limited to vaginal bleeding, contractions, leaking of fluid and fetal movement were reviewed in detail with the patient. Please refer to After Visit Summary for other counseling recommendations.  RTO 2 wks   Aviva SignsMarie  L Jenavive Lamboy, CNM

## 2016-04-02 LAB — GLUCOSE TOLERANCE, 2 HOURS W/ 1HR
Glucose, 1 hour: 112 mg/dL
Glucose, 2 hour: 94 mg/dL
Glucose, Fasting: 70 mg/dL (ref 65–99)

## 2016-04-02 LAB — RPR

## 2016-04-02 LAB — HIV ANTIBODY (ROUTINE TESTING W REFLEX): HIV 1&2 Ab, 4th Generation: NONREACTIVE

## 2016-04-15 ENCOUNTER — Ambulatory Visit (INDEPENDENT_AMBULATORY_CARE_PROVIDER_SITE_OTHER): Payer: Medicaid Other | Admitting: Advanced Practice Midwife

## 2016-04-15 VITALS — BP 105/66 | HR 89 | Wt 158.0 lb

## 2016-04-15 DIAGNOSIS — D649 Anemia, unspecified: Secondary | ICD-10-CM

## 2016-04-15 DIAGNOSIS — Z23 Encounter for immunization: Secondary | ICD-10-CM | POA: Diagnosis not present

## 2016-04-15 DIAGNOSIS — O99013 Anemia complicating pregnancy, third trimester: Secondary | ICD-10-CM

## 2016-04-15 DIAGNOSIS — Z3480 Encounter for supervision of other normal pregnancy, unspecified trimester: Secondary | ICD-10-CM

## 2016-04-15 MED ORDER — FERROUS SULFATE 325 (65 FE) MG PO TABS: 325.0000 mg | ORAL_TABLET | Freq: Every day | ORAL | 1 refills | Status: DC

## 2016-04-15 NOTE — Patient Instructions (Signed)
Iron-Rich Diet Iron is a mineral that helps your body to produce hemoglobin. Hemoglobin is a protein in your red blood cells that carries oxygen to your body's tissues. Eating too little iron may cause you to feel weak and tired, and it can increase your risk for infection. Eating enough iron is necessary for your body's metabolism, muscle function, and nervous system. Iron is naturally found in many foods. It can also be added to foods or fortified in foods. There are two types of dietary iron:  Heme iron. Heme iron is absorbed by the body more easily than nonheme iron. Heme iron is found in meat, poultry, and fish.  Nonheme iron. Nonheme iron is found in dietary supplements, iron-fortified grains, beans, and vegetables. You may need to follow an iron-rich diet if:  You have been diagnosed with iron deficiency or iron-deficiency anemia.  You have a condition that prevents you from absorbing dietary iron, such as:  Infection in your intestines.  Celiac disease. This involves long-lasting (chronic) inflammation of your intestines.  You do not eat enough iron.  You eat a diet that is high in foods that impair iron absorption.  You have lost a lot of blood.  You have heavy bleeding during your menstrual cycle.  You are pregnant. What is my plan? Your health care provider may help you to determine how much iron you need per day based on your condition. Generally, when a person consumes sufficient amounts of iron in the diet, the following iron needs are met:  Men.  24-56 years old: 11 mg per day.  59-71 years old: 8 mg per day.  Women.  46-49 years old: 15 mg per day.  61-31 years old: 18 mg per day.  Over 8 years old: 8 mg per day.  Pregnant women: 27 mg per day.  Breastfeeding women: 9 mg per day. What do I need to know about an iron-rich diet?  Eat fresh fruits and vegetables that are high in vitamin C along with foods that are high in iron. This will help increase the  amount of iron that your body absorbs from food, especially with foods containing nonheme iron. Foods that are high in vitamin C include oranges, peppers, tomatoes, and mango.  Take iron supplements only as directed by your health care provider. Overdose of iron can be life-threatening. If you were prescribed iron supplements, take them with orange juice or a vitamin C supplement.  Cook foods in pots and pans that are made from iron.  Eat nonheme iron-containing foods alongside foods that are high in heme iron. This helps to improve your iron absorption.  Certain foods and drinks contain compounds that impair iron absorption. Avoid eating these foods in the same meal as iron-rich foods or with iron supplements. These include:  Coffee, black tea, and red wine.  Milk, dairy products, and foods that are high in calcium.  Beans, soybeans, and peas.  Whole grains.  When eating foods that contain both nonheme iron and compounds that impair iron absorption, follow these tips to absorb iron better.  Soak beans overnight before cooking.  Soak whole grains overnight and drain them before using.  Ferment flours before baking, such as using yeast in bread dough. What foods can I eat? Grains  Iron-fortified breakfast cereal. Iron-fortified whole-wheat bread. Enriched rice. Sprouted grains. Vegetables  Spinach. Potatoes with skin. Green peas. Broccoli. Red and green bell peppers. Fermented vegetables. Fruits  Prunes. Raisins. Oranges. Strawberries. Mango. Grapefruit. Meats and Other Protein Sources  Beef liver. Oysters. Beef. Shrimp. [place]. Chicken. [place]. Sardines. Chickpeas. Nuts. Tofu. Beverages  Tomato juice. Fresh orange juice. Prune juice. Hibiscus tea. Fortified instant breakfast shakes. Condiments  Tahini. Fermented soy sauce. Sweets and Desserts  Black-strap molasses. Other  Wheat germ. The items listed above may not be a complete list of recommended foods or beverages. Contact  your dietitian for more options.  What foods are not recommended? Grains  Whole grains. Bran cereal. Bran flour. Oats. Vegetables  Artichokes. Brussels sprouts. Kale. Fruits  Blueberries. Raspberries. Strawberries. Figs. Meats and Other Protein Sources  Soybeans. Products made from soy protein. Dairy  Milk. Cream. Cheese. Yogurt. Cottage cheese. Beverages  Coffee. Black tea. Red wine. Sweets and Desserts  Cocoa. Chocolate. Ice cream. Other  [place]. Oregano. Parsley. The items listed above may not be a complete list of foods and beverages to avoid. Contact your dietitian for more information.  This information is not intended to replace advice given to you by your health care provider. Make sure you discuss any questions you have with your health care provider. Document Released: 09/14/2004 Document Revised: 08/21/2015 Document Reviewed: 08/28/2013 Elsevier Interactive Patient Education  2017 [employer].  Tdap Vaccine (Tetanus, Diphtheria and Pertussis): What You Need to Know 1. Why get vaccinated? Tetanus, diphtheria and pertussis are very serious diseases. Tdap vaccine can protect [place] from these diseases. And, Tdap vaccine given to pregnant women can protect newborn babies against pertussis. TETANUS (Lockjaw) is rare in the [place] today. It causes painful muscle tightening and stiffness, usually all over the body.  It can lead to tightening of muscles in the head and neck so you can't open your mouth, swallow, or sometimes even breathe. Tetanus kills about 1 out of 10 people who are infected even after receiving the best medical care. DIPHTHERIA is also rare in the [place] today. It can cause a thick coating to form in the back of the throat.  It can lead to breathing problems, heart failure, paralysis, and death. PERTUSSIS (Whooping Cough) causes severe coughing spells, which can cause difficulty breathing, vomiting and disturbed sleep.  It can also lead to weight  loss, incontinence, and rib fractures. Up to 2 in 100 adolescents and 5 in 100 adults with pertussis are hospitalized or have complications, which could include pneumonia or death. These diseases are caused by bacteria. Diphtheria and pertussis are spread from person to person through secretions from coughing or sneezing. Tetanus enters the body through cuts, scratches, or wounds. Before vaccines, as many as 200,000 cases of diphtheria, 200,000 cases of pertussis, and hundreds of cases of tetanus, were reported in the [place] each year. Since vaccination began, reports of cases for tetanus and diphtheria have dropped by about 99% and for pertussis by about 80%. 2. Tdap vaccine Tdap vaccine can protect adolescents and adults from tetanus, diphtheria, and pertussis. One dose of Tdap is routinely given at age 36 or 28. People who did not get Tdap at that age should get it as soon as possible. Tdap is especially important for healthcare professionals and anyone having close contact with a baby younger than 12 months. Pregnant women should get a dose of Tdap during every pregnancy, to protect the newborn from pertussis. Infants are most at risk for severe, life-threatening complications from pertussis. Another vaccine, called Td, protects against tetanus and diphtheria, but not pertussis. A Td booster should be given every 10 years. Tdap may be given as one of these boosters if you have never gotten  Tdap before. Tdap may also be given after a severe cut or burn to prevent tetanus infection. Your doctor or the person giving you the vaccine can give you more information. Tdap may safely be given at the same time as other vaccines. 3. Some people should not get this vaccine  A person who has ever had a life-threatening allergic reaction after a previous dose of any diphtheria, tetanus or pertussis containing vaccine, OR has a severe allergy to any part of this vaccine, should not get Tdap vaccine. Tell the  person giving the vaccine about any severe allergies.  Anyone who had coma or long repeated seizures within 7 days after a childhood dose of DTP or DTaP, or a previous dose of Tdap, should not get Tdap, unless a cause other than the vaccine was found. They can still get Td.  Talk to your doctor if you:  have seizures or another nervous system problem,  had severe pain or swelling after any vaccine containing diphtheria, tetanus or pertussis,  ever had a condition called Guillain-Barr Syndrome (GBS),  aren't feeling well on the day the shot is scheduled. 4. Risks With any medicine, including vaccines, there is a chance of side effects. These are usually mild and go away on their own. Serious reactions are also possible but are rare. Most people who get Tdap vaccine do not have any problems with it. Mild problems following Tdap:  (Did not interfere with activities)  Pain where the shot was given (about 3 in 4 adolescents or 2 in 3 adults)  Redness or swelling where the shot was given (about 1 person in 5)  Mild fever of at least 100.87F (up to about 1 in 25 adolescents or 1 in 100 adults)  Headache (about 3 or 4 people in 10)  Tiredness (about 1 person in 3 or 4)  Nausea, vomiting, diarrhea, stomach ache (up to 1 in 4 adolescents or 1 in 10 adults)  Chills, sore joints (about 1 person in 10)  Body aches (about 1 person in 3 or 4)  Rash, swollen glands (uncommon) Moderate problems following Tdap:  (Interfered with activities, but did not require medical attention)  Pain where the shot was given (up to 1 in 5 or 6)  Redness or swelling where the shot was given (up to about 1 in 16 adolescents or 1 in 12 adults)  Fever over 102F (about 1 in 100 adolescents or 1 in 250 adults)  Headache (about 1 in 7 adolescents or 1 in 10 adults)  Nausea, vomiting, diarrhea, stomach ache (up to 1 or 3 people in 100)  Swelling of the entire arm where the shot was given (up to about 1 in  500). Severe problems following Tdap:  (Unable to perform usual activities; required medical attention)  Swelling, severe pain, bleeding and redness in the arm where the shot was given (rare). Problems that could happen after any vaccine:   People sometimes faint after a medical procedure, including vaccination. Sitting or lying down for about 15 minutes can help prevent fainting, and injuries caused by a fall. Tell your doctor if you feel dizzy, or have vision changes or ringing in the ears.  Some people get severe pain in the shoulder and have difficulty moving the arm where a shot was given. This happens very rarely.  Any medication can cause a severe allergic reaction. Such reactions from a vaccine are very rare, estimated at fewer than 1 in a million doses, and would happen within  a few minutes to a few hours after the vaccination. As with any medicine, there is a very remote chance of a vaccine causing a serious injury or death. The safety of vaccines is always being monitored. For more information, visit: http://www.aguilar.org/ 5. What if there is a serious problem? What should I look for?  Look for anything that concerns you, such as signs of a severe allergic reaction, very high fever, or unusual behavior. Signs of a severe allergic reaction can include hives, swelling of the face and throat, difficulty breathing, a fast heartbeat, dizziness, and weakness. These would usually start a few minutes to a few hours after the vaccination. What should I do?   If you think it is a severe allergic reaction or other emergency that can't wait, call 9-1-1 or get the person to the nearest hospital. Otherwise, call your doctor.  Afterward, the reaction should be reported to the Vaccine Adverse Event Reporting System (VAERS). Your doctor might file this report, or you can do it yourself through the VAERS web site at www.vaers.SamedayNews.es, or by calling (314)563-2235.  VAERS does not give medical  advice. 6. The National Vaccine Injury Compensation Program The [employer] Vaccine Injury Compensation Program (VICP) is a federal program that was created to compensate people who may have been injured by certain vaccines. Persons who believe they may have been injured by a vaccine can learn about the program and about filing a claim by calling 819-814-9079 or visiting the [employer] website at GoldCloset.com.ee. There is a time limit to file a claim for compensation. 7. How can I learn more?  Ask your doctor. He or she can give you the vaccine package insert or suggest other sources of information.  Call your local or state health department.  Contact the Centers for Disease Control and Prevention (CDC):  Call 484-306-7018 (1-800-CDC-INFO) or  Visit CDC's website at http://hunter.com/ CDC Tdap Vaccine VIS (04/09/13) This information is not intended to replace advice given to you by your health care provider. Make sure you discuss any questions you have with your health care provider. Document Released: 08/02/2011 Document Revised: 10/22/2015 Document Reviewed: 10/22/2015 Elsevier Interactive Patient Education  2017 [employer].

## 2016-04-16 NOTE — Progress Notes (Signed)
   PRENATAL VISIT NOTE  Subjective:  Jake Batheouara Willy is a 36 y.o. G2P0010 at 3442w1d being seen today for ongoing prenatal care.  She is currently monitored for the following issues for this low-risk pregnancy and has Supervision of normal pregnancy, antepartum and Back spasm on her problem list.  Patient reports no complaints.  Contractions: Not present. Vag. Bleeding: None.  Movement: Present. Denies leaking of fluid.   The following portions of the patient's history were reviewed and updated as appropriate: allergies, current medications, past family history, past medical history, past social history, past surgical history and problem list. Problem list updated.  Objective:   Vitals:   04/15/16 1006  BP: 105/66  Pulse: 89  Weight: 158 lb (71.7 kg)    Fetal Status: Fetal Heart Rate (bpm): 151 Fundal Height: 30 cm Movement: Present  Presentation: Vertex  General:  Alert, oriented and cooperative. Patient is in no acute distress.  Skin: Skin is warm and dry. No rash noted.   Cardiovascular: Normal heart rate noted  Respiratory: Normal respiratory effort, no problems with respiration noted  Abdomen: Soft, gravid, appropriate for gestational age. Pain/Pressure: Present     Pelvic:  Cervical exam deferred        Extremities: Normal range of motion.  Edema: None  Mental Status: Normal mood and affect. Normal behavior. Normal judgment and thought content.   Assessment and Plan:  Pregnancy: G2P0010 at 3442w1d  1. Supervision of other normal pregnancy, antepartum   2. Anemia during pregnancy in third trimester  - ferrous sulfate (FERROUSUL) 325 (65 FE) MG tablet; Take 1 tablet (325 mg total) by mouth daily with breakfast.  Dispense: 60 tablet; Refill: 1  3. Need for Tdap vaccination  - Tdap vaccine greater than or equal to 7yo IM  Preterm labor symptoms and general obstetric precautions including but not limited to vaginal bleeding, contractions, leaking of fluid and fetal movement  were reviewed in detail with the patient. Please refer to After Visit Summary for other counseling recommendations.  Return for ROB.   Dorathy KinsmanVirginia Berdella Bacot, CNM

## 2016-04-29 ENCOUNTER — Ambulatory Visit (INDEPENDENT_AMBULATORY_CARE_PROVIDER_SITE_OTHER): Payer: Medicaid Other | Admitting: Family

## 2016-04-29 VITALS — BP 99/60 | HR 92 | Wt 157.0 lb

## 2016-04-29 DIAGNOSIS — Z3480 Encounter for supervision of other normal pregnancy, unspecified trimester: Secondary | ICD-10-CM

## 2016-04-29 DIAGNOSIS — Z3483 Encounter for supervision of other normal pregnancy, third trimester: Secondary | ICD-10-CM

## 2016-04-29 DIAGNOSIS — K219 Gastro-esophageal reflux disease without esophagitis: Secondary | ICD-10-CM

## 2016-04-29 MED ORDER — PANTOPRAZOLE SODIUM 20 MG PO TBEC: 20.0000 mg | DELAYED_RELEASE_TABLET | Freq: Every day | ORAL | 1 refills | Status: DC

## 2016-04-29 NOTE — Progress Notes (Signed)
   PRENATAL VISIT NOTE  Subjective:  Carrie Durham is a 36 y.o. G2P0010 at 315w0d being seen today for ongoing prenatal care.  She is currently monitored for the following issues for this low-risk pregnancy and has Supervision of normal pregnancy, antepartum and Back spasm on her problem list.  Patient reports reflux when laying supine.  Contractions: Not present. Vag. Bleeding: None.  Movement: Present. Denies leaking of fluid.   The following portions of the patient's history were reviewed and updated as appropriate: allergies, current medications, past family history, past medical history, past social history, past surgical history and problem list. Problem list updated.  Objective:   Vitals:   04/29/16 1035  BP: 99/60  Pulse: 92  Weight: 157 lb (71.2 kg)    Fetal Status: Fetal Heart Rate (bpm): 146 Fundal Height: 33 cm Movement: Present     General:  Alert, oriented and cooperative. Patient is in no acute distress.  Skin: Skin is warm and dry. No rash noted.   Cardiovascular: Normal heart rate noted  Respiratory: Normal respiratory effort, no problems with respiration noted  Abdomen: Soft, gravid, appropriate for gestational age. Pain/Pressure: Present     Pelvic:  Cervical exam deferred        Extremities: Normal range of motion.  Edema: None  Mental Status: Normal mood and affect. Normal behavior. Normal judgment and thought content.   Assessment and Plan:  Pregnancy: G2P0010 at 685w0d  1. Gastroesophageal reflux disease, esophagitis presence not specified - pantoprazole (PROTONIX) 20 MG tablet; Take 1 tablet (20 mg total) by mouth daily.  Dispense: 30 tablet; Refill: 1  2. Supervision of other normal pregnancy, antepartum - Reviewed third trimester labs  Preterm labor symptoms and general obstetric precautions including but not limited to vaginal bleeding, contractions, leaking of fluid and fetal movement were reviewed in detail with the patient. Please refer to After  Visit Summary for other counseling recommendations.  Return in about 2 weeks (around 05/13/2016).   Eino FarberWalidah Kennith GainN Karim, CNM

## 2016-05-09 ENCOUNTER — Other Ambulatory Visit: Payer: Self-pay

## 2016-05-09 DIAGNOSIS — O219 Vomiting of pregnancy, unspecified: Secondary | ICD-10-CM

## 2016-05-09 MED ORDER — ONDANSETRON HCL 4 MG PO TABS: 4.0000 mg | ORAL_TABLET | Freq: Three times a day (TID) | ORAL | 0 refills | Status: DC | PRN

## 2016-05-20 ENCOUNTER — Encounter: Payer: Medicaid Other | Admitting: Advanced Practice Midwife

## 2016-05-23 ENCOUNTER — Encounter: Payer: Medicaid Other | Admitting: Advanced Practice Midwife

## 2016-05-27 ENCOUNTER — Other Ambulatory Visit (HOSPITAL_COMMUNITY)
Admission: RE | Admit: 2016-05-27 | Discharge: 2016-05-27 | Disposition: A | Discharge status: Home / Self Care | Payer: Medicaid Other | Source: Ambulatory Visit | Attending: Advanced Practice Midwife | Admitting: Advanced Practice Midwife

## 2016-05-27 ENCOUNTER — Ambulatory Visit (INDEPENDENT_AMBULATORY_CARE_PROVIDER_SITE_OTHER): Payer: Medicaid Other | Admitting: Advanced Practice Midwife

## 2016-05-27 VITALS — BP 110/74 | HR 110 | Wt 158.0 lb

## 2016-05-27 DIAGNOSIS — Z3A36 36 weeks gestation of pregnancy: Secondary | ICD-10-CM | POA: Diagnosis not present

## 2016-05-27 DIAGNOSIS — Z3493 Encounter for supervision of normal pregnancy, unspecified, third trimester: Secondary | ICD-10-CM

## 2016-05-27 DIAGNOSIS — J069 Acute upper respiratory infection, unspecified: Secondary | ICD-10-CM

## 2016-05-27 DIAGNOSIS — Z3480 Encounter for supervision of other normal pregnancy, unspecified trimester: Secondary | ICD-10-CM

## 2016-05-27 DIAGNOSIS — Z3483 Encounter for supervision of other normal pregnancy, third trimester: Secondary | ICD-10-CM

## 2016-05-27 LAB — OB RESULTS CONSOLE GBS: GBS: NEGATIVE

## 2016-05-27 LAB — OB RESULTS CONSOLE GC/CHLAMYDIA: Gonorrhea: NEGATIVE

## 2016-05-27 NOTE — Patient Instructions (Signed)
Braxton Hicks Contractions °Contractions of the uterus can occur throughout pregnancy, but they are not always a sign that you are in labor. You may have practice contractions called Braxton Hicks contractions. These false labor contractions are sometimes confused with true labor. °What are Braxton Hicks contractions? °Braxton Hicks contractions are tightening movements that occur in the muscles of the uterus before labor. Unlike true labor contractions, these contractions do not result in opening (dilation) and thinning of the cervix. Toward the end of pregnancy (32-34 weeks), Braxton Hicks contractions can happen more often and may become stronger. These contractions are sometimes difficult to tell apart from true labor because they can be very uncomfortable. You should not feel embarrassed if you go to the hospital with false labor. °Sometimes, the only way to tell if you are in true labor is for your health care provider to look for changes in the cervix. The health care provider will do a physical exam and may monitor your contractions. If you are not in true labor, the exam should show that your cervix is not dilating and your water has not broken. °If there are no prenatal problems or other health problems associated with your pregnancy, it is completely safe for you to be sent home with false labor. You may continue to have Braxton Hicks contractions until you go into true labor. °How can I tell the difference between true labor and false labor? °· Differences °¨ False labor °¨ Contractions last 30-70 seconds.: Contractions are usually shorter and not as strong as true labor contractions. °¨ Contractions become very regular.: Contractions are usually irregular. °¨ Discomfort is usually felt in the top of the uterus, and it spreads to the lower abdomen and low back.: Contractions are often felt in the front of the lower abdomen and in the groin. °¨ Contractions do not go away with walking.: Contractions may  go away when you walk around or change positions while lying down. °¨ Contractions usually become more intense and increase in frequency.: Contractions get weaker and are shorter-lasting as time goes on. °¨ The cervix dilates and gets thinner.: The cervix usually does not dilate or become thin. °Follow these instructions at home: °¨ Take over-the-counter and prescription medicines only as told by your health care provider. °¨ Keep up with your usual exercises and follow other instructions from your health care provider. °¨ Eat and drink lightly if you think you are going into labor. °¨ If Braxton Hicks contractions are making you uncomfortable: °¨ Change your position from lying down or resting to walking, or change from walking to resting. °¨ Sit and rest in a tub of warm water. °¨ Drink enough fluid to keep your urine clear or pale yellow. Dehydration may cause these contractions. °¨ Do slow and deep breathing several times an hour. °¨ Keep all follow-up prenatal visits as told by your health care provider. This is important. °Contact a health care provider if: °¨ You have a fever. °¨ You have continuous pain in your abdomen. °Get help right away if: °¨ Your contractions become stronger, more regular, and closer together. °¨ You have fluid leaking or gushing from your vagina. °¨ You pass blood-tinged mucus (bloody show). °¨ You have bleeding from your vagina. °¨ You have low back pain that you never had before. °¨ You feel your baby’s head pushing down and causing pelvic pressure. °¨ Your baby is not moving inside you as much as it used to. °Summary °¨ Contractions that occur before labor are   called Braxton Hicks contractions, false labor, or practice contractions. °¨ Braxton Hicks contractions are usually shorter, weaker, farther apart, and less regular than true labor contractions. True labor contractions usually become progressively stronger and regular and they become more frequent. °¨ Manage discomfort from  Braxton Hicks contractions by changing position, resting in a warm bath, drinking plenty of water, or practicing deep breathing. °This information is not intended to replace advice given to you by your health care provider. Make sure you discuss any questions you have with your health care provider. °Document Released: 01/31/2005 Document Revised: 12/21/2015 Document Reviewed: 12/21/2015 °Elsevier Interactive Patient Education © 2017 [employer] °Fetal Movement Counts °Patient Name: ________________________________________________ Patient Due Date: ____________________ °What is a fetal movement count? °A fetal movement count is the number of times that you feel your baby move during a certain amount of time. This may also be called a fetal kick count. A fetal movement count is recommended for every pregnant woman. You may be asked to start counting fetal movements as early as week 28 of your pregnancy. °Pay attention to when your baby is most active. You may notice your baby's sleep and wake cycles. You may also notice things that make your baby move more. You should do a fetal movement count: °· When your baby is normally most active. °· At the same time each day. °A good time to count movements is while you are resting, after having something to eat and drink. °How do I count fetal movements? °1. Find a quiet, comfortable area. Sit, or lie down on your side. °2. Write down the date, the start time and stop time, and the number of movements that you felt between those two times. Take this information with you to your health care visits. °3. For 2 hours, count kicks, flutters, swishes, rolls, and jabs. You should feel at least 10 movements during 2 hours. °4. You may stop counting after you have felt 10 movements. °5. If you do not feel 10 movements in 2 hours, have something to eat and drink. Then, keep resting and counting for 1 hour. If you feel at least 4 movements during that hour, you may stop  counting. °Contact a health care provider if: °· You feel fewer than 4 movements in 2 hours. °· Your baby is not moving like he or she usually does. °Date: ____________ Start time: ____________ Stop time: ____________ Movements: ____________ °Date: ____________ Start time: ____________ Stop time: ____________ Movements: ____________ °Date: ____________ Start time: ____________ Stop time: ____________ Movements: ____________ °Date: ____________ Start time: ____________ Stop time: ____________ Movements: ____________ °Date: ____________ Start time: ____________ Stop time: ____________ Movements: ____________ °Date: ____________ Start time: ____________ Stop time: ____________ Movements: ____________ °Date: ____________ Start time: ____________ Stop time: ____________ Movements: ____________ °Date: ____________ Start time: ____________ Stop time: ____________ Movements: ____________ °Date: ____________ Start time: ____________ Stop time: ____________ Movements: ____________ °This information is not intended to replace advice given to you by your health care provider. Make sure you discuss any questions you have with your health care provider. °Document Released: 03/02/2006 Document Revised: 09/30/2015 Document Reviewed: 03/12/2015 °Elsevier Interactive Patient Education © 2017 [employer] ° °

## 2016-05-27 NOTE — Progress Notes (Signed)
   PRENATAL VISIT NOTE  Subjective:  Carrie Durham is a 36 y.o. G2P0010 at [redacted]w[redacted]d being seen today for ongoing prenatal care.  She is currently monitored for the following issues for this low-risk pregnancy and has Supervision of normal pregnancy, antepartum and Back spasm on her problem list.  Patient reports nasal congestion and runny nose since yesterday and occasional contractions.  Contractions: Irritability. Vag. Bleeding: None.  Movement: Present. Denies leaking of fluid, fever, chills, sore throat, cough.   The following portions of the patient's history were reviewed and updated as appropriate: allergies, current medications, past family history, past medical history, past social history, past surgical history and problem list. Problem list updated.  Objective:   Vitals:   05/27/16 1057  BP: 110/74  Pulse: (!) 110  Weight: 158 lb (71.7 kg)    Fetal Status: Fetal Heart Rate (bpm): 151 Fundal Height: 36 cm Movement: Present  Presentation: Vertex  General:  Alert, oriented and cooperative. Patient is in no acute distress.  Skin: Skin is warm and dry. No rash noted.   Cardiovascular: Normal heart rate noted  Respiratory: Normal respiratory effort, no problems with respiration noted  Abdomen: Soft, gravid, appropriate for gestational age. Pain/Pressure: Absent     Pelvic:  Cervical exam performed Dilation: Closed Effacement (%): 0 Station: Ballotable  Extremities: Normal range of motion.  Edema: None  Mental Status: Normal mood and affect. Normal behavior. Normal judgment and thought content.   Assessment and Plan:  Pregnancy: G2P0010 at [redacted]w[redacted]d  1. Encounter for supervision of normal pregnancy in third trimester, unspecified gravidity  - Culture, beta strep (group b only) - Urine cytology ancillary only   2. Viral URI vs seasonal allergies. - Comfort measures. List of meds safe in pregnancy given.   Term labor symptoms and general obstetric precautions including but not  limited to vaginal bleeding, contractions, leaking of fluid and fetal movement were reviewed in detail with the patient. Please refer to After Visit Summary for other counseling recommendations.  Return in about 1 week (around 06/03/2016) for ROB.   Dorathy Kinsman, CNM

## 2016-05-28 ENCOUNTER — Encounter (HOSPITAL_COMMUNITY): Payer: Self-pay | Admitting: *Deleted

## 2016-05-28 ENCOUNTER — Inpatient Hospital Stay (HOSPITAL_COMMUNITY)
Admission: AD | Admit: 2016-05-28 | Discharge: 2016-05-28 | Disposition: A | Discharge status: Home / Self Care | Payer: Medicaid Other | Source: Ambulatory Visit | Attending: Obstetrics & Gynecology | Admitting: Obstetrics & Gynecology

## 2016-05-28 DIAGNOSIS — O99513 Diseases of the respiratory system complicating pregnancy, third trimester: Secondary | ICD-10-CM | POA: Diagnosis not present

## 2016-05-28 DIAGNOSIS — R509 Fever, unspecified: Secondary | ICD-10-CM | POA: Diagnosis present

## 2016-05-28 DIAGNOSIS — O9989 Other specified diseases and conditions complicating pregnancy, childbirth and the puerperium: Secondary | ICD-10-CM | POA: Diagnosis not present

## 2016-05-28 DIAGNOSIS — R05 Cough: Secondary | ICD-10-CM | POA: Diagnosis present

## 2016-05-28 DIAGNOSIS — Z3A36 36 weeks gestation of pregnancy: Secondary | ICD-10-CM | POA: Diagnosis not present

## 2016-05-28 DIAGNOSIS — J069 Acute upper respiratory infection, unspecified: Secondary | ICD-10-CM

## 2016-05-28 LAB — URINALYSIS, ROUTINE W REFLEX MICROSCOPIC
BILIRUBIN URINE: NEGATIVE
GLUCOSE, UA: NEGATIVE mg/dL
Ketones, ur: NEGATIVE mg/dL
NITRITE: NEGATIVE
Protein, ur: 30 mg/dL — AB
SPECIFIC GRAVITY, URINE: 1.021 (ref 1.005–1.030)
pH: 5 (ref 5.0–8.0)

## 2016-05-28 LAB — INFLUENZA PANEL BY PCR (TYPE A & B)
Influenza A By PCR: NEGATIVE
Influenza B By PCR: NEGATIVE

## 2016-05-28 LAB — RAPID STREP SCREEN (MED CTR MEBANE ONLY): Streptococcus, Group A Screen (Direct): NEGATIVE

## 2016-05-28 MED ORDER — BENZONATATE 100 MG PO CAPS: 100.0000 mg | ORAL_CAPSULE | Freq: Three times a day (TID) | ORAL | 0 refills | Status: DC | PRN

## 2016-05-28 MED ORDER — ACETAMINOPHEN 500 MG PO TABS
1000.0000 mg | ORAL_TABLET | Freq: Once | ORAL | Status: AC
  Administered 2016-05-28: 1000 mg via ORAL
  Filled 2016-05-28: qty 2

## 2016-05-28 MED ORDER — BENZONATATE 100 MG PO CAPS
200.0000 mg | ORAL_CAPSULE | Freq: Once | ORAL | Status: AC
  Administered 2016-05-28: 200 mg via ORAL
  Filled 2016-05-28: qty 2

## 2016-05-28 NOTE — MAU Provider Note (Signed)
History     CSN: [id number]  Arrival date and time: 05/28/16 1028  First Provider Initiated Contact with Patient 05/28/16 1113      Chief Complaint  Patient presents with  . Fever  . Cough  . head congestion   HPI  Carrie Durham is a 36 y.o. G2P0010 at [redacted]w[redacted]d. Patient here with complaints of cough, sore throat, & congestion. Symptoms began 2 days ago. States she thinks she had a fever b/c she felt hot but did not check her temperature. Constant non productive cough. Denies chest pain, SOB, or wheezing & no hx of asthma or bronchitis. Sore throat rates 5/10. Swallowing makes pain worse; no difficulty swallowing or excessive drooling. Denies sinus pain, ear pain, rhinorrhea, abdominal pain, n/v, vaginal bleeding, or LOF. Positive fetal movement. Has not treated symptoms at home. Denies sick contacts & did not get flu vaccine this season.   OB History    Gravida Para Term Preterm AB Living   2 0 0 0 1 0   SAB TAB Ectopic Multiple Live Births   1 0 0 0 0      Past Medical History:  Diagnosis Date  . Medical history non-contributory     Past Surgical History:  Procedure Laterality Date  . NO PAST SURGERIES      History reviewed. No pertinent family history.  Social History  Substance Use Topics  . Smoking status: Never Smoker  . Smokeless tobacco: Never Used  . Alcohol use No    Allergies: No Known Allergies  Prescriptions Prior to Admission  Medication Sig Dispense Refill Last Dose  . cyclobenzaprine (FLEXERIL) 5 MG tablet Take 1 tablet (5 mg total) by mouth at bedtime as needed for muscle spasms. 30 tablet 0 Taking  . ferrous sulfate (FERROUSUL) 325 (65 FE) MG tablet Take 1 tablet (325 mg total) by mouth daily with breakfast. 60 tablet 1 Taking  . hydrocortisone-pramoxine (PROCTOFOAM HC) rectal foam Place 1 applicator rectally 2 (two) times daily. 10 g 0 Taking  . ondansetron (ZOFRAN) 4 MG tablet Take 1 tablet (4 mg total) by mouth every 8 (eight) hours as needed for  nausea or vomiting. 20 tablet 0 Taking  . pantoprazole (PROTONIX) 20 MG tablet Take 1 tablet (20 mg total) by mouth daily. 30 tablet 1 Taking  . Prenat-FeFum-FePo-FA-Omega 3 (CONCEPT DHA) 53.5-38-1 MG CAPS Take 1 tablet by mouth daily. 30 capsule 2 Taking    Review of Systems  Constitutional: Negative for chills and fever.  HENT: Positive for congestion and sore throat. Negative for drooling, ear pain, rhinorrhea, sinus pain, sinus pressure, sneezing and trouble swallowing.   Respiratory: Positive for cough. Negative for chest tightness, shortness of breath and wheezing.   Cardiovascular: Negative for chest pain.  Gastrointestinal: Negative.   Genitourinary: Negative.   Musculoskeletal: Negative for myalgias, neck pain and neck stiffness.  Neurological: Negative for dizziness and headaches.   Physical Exam   Blood pressure 102/61, pulse 91, temperature 98 F (36.7 C), resp. rate 18, height 5' 2.5" (1.588 m), weight 157 lb (71.2 kg), last menstrual period 09/18/2015.  Physical Exam  Nursing note and vitals reviewed. Constitutional: She is oriented to person, place, and time. She appears well-developed and well-nourished. No distress.  HENT:  Head: Normocephalic and atraumatic.  Right Ear: Tympanic membrane normal.  Left Ear: Tympanic membrane normal.  Nose: Nose normal. Right sinus exhibits no maxillary sinus tenderness and no frontal sinus tenderness. Left sinus exhibits no maxillary sinus tenderness and no frontal sinus  tenderness.  Mouth/Throat: Uvula is midline and mucous membranes are normal. Posterior oropharyngeal erythema present. No oropharyngeal exudate, posterior oropharyngeal edema or tonsillar abscesses.  Eyes: Conjunctivae are normal. Right eye exhibits no discharge. Left eye exhibits no discharge. No scleral icterus.  Neck: Normal range of motion.  Cardiovascular: Normal rate, regular rhythm and normal heart sounds.   No murmur heard. Respiratory: Effort normal and  breath sounds normal. No respiratory distress. She has no wheezes.  GI: Soft.  Neurological: She is alert and oriented to person, place, and time.  Skin: Skin is warm and dry. She is not diaphoretic.  Psychiatric: She has a normal mood and affect. Her behavior is normal. Judgment and thought content normal.   Fetal Tracing:  Baseline: 140 Variability: moderate Accelerations: 15x15 Decelerations: none  Toco: x2 MAU Course  Procedures Results for orders placed or performed during the hospital encounter of 05/28/16 (from the past 48 hour(s))  Urinalysis, Routine w reflex microscopic     Status: Abnormal   Collection Time: 05/28/16 10:30 AM  Result Value Ref Range   Color, Urine AMBER (A) YELLOW    Comment: BIOCHEMICALS MAY BE AFFECTED BY COLOR   APPearance TURBID (A) CLEAR   Specific Gravity, Urine 1.021 1.005 - 1.030   pH 5.0 5.0 - 8.0   Glucose, UA NEGATIVE NEGATIVE mg/dL   Hgb urine dipstick SMALL (A) NEGATIVE   Bilirubin Urine NEGATIVE NEGATIVE   Ketones, ur NEGATIVE NEGATIVE mg/dL   Protein, ur 30 (A) NEGATIVE mg/dL   Nitrite NEGATIVE NEGATIVE   Leukocytes, UA MODERATE (A) NEGATIVE   RBC / HPF 6-30 0 - 5 RBC/hpf   WBC, UA 6-30 0 - 5 WBC/hpf   Bacteria, UA RARE (A) NONE SEEN   Squamous Epithelial / LPF TOO NUMEROUS TO COUNT (A) NONE SEEN   Mucous PRESENT   Influenza panel by PCR (type A & B)     Status: None   Collection Time: 05/28/16 10:50 AM  Result Value Ref Range   Influenza A By PCR NEGATIVE NEGATIVE   Influenza B By PCR NEGATIVE NEGATIVE    Comment: (NOTE) The Xpert Xpress Flu assay is intended as an aid in the diagnosis of  influenza and should not be used as a sole basis for treatment.  This  assay is FDA approved for nasopharyngeal swab specimens only. Nasal  washings and aspirates are unacceptable for Xpert Xpress Flu testing.   Rapid strep screen (not at [facility])     Status: None   Collection Time: 05/28/16 11:31 AM  Result Value Ref Range   Streptococcus,  Group A Screen (Direct) NEGATIVE NEGATIVE    Comment: (NOTE) A Rapid Antigen test may result negative if the antigen level in the sample is below the detection level of this test. The FDA has not cleared this test as a stand-alone test therefore the rapid antigen negative result has reflexed to a Group A Strep culture.     MDM Reactive NST VSS, pt afebrile Flu & strep swabs negative Tylenol & tessalon given in MAU while results pending Assessment and Plan  A: 1. Upper respiratory tract infection, unspecified type    P: Discharge home Discussed use of OTC meds for tx of symptoms -- list of otc meds safe in pregnancy given Hot tea, salt water gargle, push fluids Discussed reasons to return to MAU Keep f/u with OB  Judeth Horn 05/28/2016, 11:09 AM

## 2016-05-28 NOTE — Discharge Instructions (Signed)
Safe Medications in Pregnancy   Acne: Benzoyl Peroxide Salicylic Acid  Backache/Headache: Tylenol: 2 regular strength every 4 hours OR              2 Extra strength every 6 hours  Colds/Coughs/Allergies: Benadryl (alcohol free) 25 mg every 6 hours as needed Breath right strips Claritin Cepacol throat lozenges Chloraseptic throat spray Cold-Eeze- up to three times per day Cough drops, alcohol free Flonase (by prescription only) Guaifenesin Mucinex Robitussin DM (plain only, alcohol free) Saline nasal spray/drops Sudafed (pseudoephedrine) & Actifed ** use only after [redacted] weeks gestation and if you do not have high blood pressure Tylenol Vicks Vaporub Zinc lozenges Zyrtec   Constipation: Colace Ducolax suppositories Fleet enema Glycerin suppositories Metamucil Milk of magnesia Miralax Senokot Smooth move tea  Diarrhea: Kaopectate Imodium A-D  *NO pepto Bismol  Hemorrhoids: Anusol Anusol HC Preparation H Tucks  Indigestion: Tums Maalox Mylanta Zantac  Pepcid  Insomnia: Benadryl (alcohol free)  every 6 hours as needed Tylenol PM Unisom, no Gelcaps  Leg Cramps: Tums MagGel  Nausea/Vomiting:  Bonine Dramamine Emetrol Ginger extract Sea bands Meclizine  Nausea medication to take during pregnancy:  Unisom (doxylamine succinate 25 mg tablets) Take one tablet daily at bedtime. If symptoms are not adequately controlled, the dose can be increased to a maximum recommended dose of two tablets daily (1/2 tablet in the morning, 1/2 tablet mid-afternoon and one at bedtime). Vitamin B6  tablets. Take one tablet twice a day (up to 200 mg per day).  Skin Rashes: Aveeno products Benadryl cream or  every 6 hours as needed Calamine Lotion 1% cortisone cream  Yeast infection: Gyne-lotrimin 7 Monistat 7  Gum/tooth pain: Anbesol  **If taking multiple medications, please check labels to avoid duplicating the same active ingredients **take  medication as directed on the label ** Do not exceed 4000 mg of tylenol in 24 hours **Do not take medications that contain aspirin or ibuprofen         Upper Respiratory Infection, Adult Most upper respiratory infections (URIs) are caused by a virus. A URI affects the nose, throat, and upper air passages. The most common type of URI is often called "the common cold." Follow these instructions at home:  Take medicines only as told by your doctor.  Gargle warm saltwater or take cough drops to comfort your throat as told by your doctor.  Use a warm mist humidifier or inhale steam from a shower to increase air moisture. This may make it easier to breathe.  Drink enough fluid to keep your pee (urine) clear or pale yellow.  Eat soups and other clear broths.  Have a healthy diet.  Rest as needed.  Go back to work when your fever is gone or your doctor says it is okay.  You may need to stay home longer to avoid giving your URI to others.  You can also wear a face mask and wash your hands often to prevent spread of the virus.  Use your inhaler more if you have asthma.  Do not use any tobacco products, including cigarettes, chewing tobacco, or electronic cigarettes. If you need help quitting, ask your doctor. Contact a doctor if:  You are getting worse, not better.  Your symptoms are not helped by medicine.  You have chills.  You are getting more short of breath.  You have brown or red mucus.  You have yellow or brown discharge from your nose.  You have pain in your face, especially when you bend forward.  You have a fever.  You have puffy (swollen) neck glands.  You have pain while swallowing.  You have white areas in the back of your throat. Get help right away if:  You have very bad or constant:  Headache.  Ear pain.  Pain in your forehead, behind your eyes, and over your cheekbones (sinus pain).  Chest pain.  You have long-lasting (chronic) lung  disease and any of the following:  Wheezing.  Long-lasting cough.  Coughing up blood.  A change in your usual mucus.  You have a stiff neck.  You have changes in your:  Vision.  Hearing.  Thinking.  Mood. This information is not intended to replace advice given to you by your health care provider. Make sure you discuss any questions you have with your health care provider. Document Released: 07/20/2007 Document Revised: 10/04/2015 Document Reviewed: 05/08/2013 Elsevier Interactive Patient Education  2017 [employer].

## 2016-05-28 NOTE — MAU Note (Signed)
Pt presents to MAU with complaints of fever, cough and head congestion for a  Couple of days. States she threw up once this morning but that happens every morning. Denies any vaginal bleeding or abnormal discharge

## 2016-05-29 LAB — CULTURE, BETA STREP (GROUP B ONLY)

## 2016-05-30 LAB — CULTURE, GROUP A STREP (THRC)

## 2016-05-30 LAB — URINE CYTOLOGY ANCILLARY ONLY
Chlamydia: NEGATIVE
Neisseria Gonorrhea: NEGATIVE

## 2016-06-03 ENCOUNTER — Ambulatory Visit (INDEPENDENT_AMBULATORY_CARE_PROVIDER_SITE_OTHER): Payer: Medicaid Other | Admitting: Family

## 2016-06-03 VITALS — BP 115/74 | HR 104 | Wt 159.0 lb

## 2016-06-03 DIAGNOSIS — Z3493 Encounter for supervision of normal pregnancy, unspecified, third trimester: Secondary | ICD-10-CM

## 2016-06-03 DIAGNOSIS — Z3483 Encounter for supervision of other normal pregnancy, third trimester: Secondary | ICD-10-CM

## 2016-06-03 NOTE — Progress Notes (Signed)
Feels the baby is coming, would like to have cervix checked today.

## 2016-06-03 NOTE — Progress Notes (Signed)
   PRENATAL VISIT NOTE  Subjective:  Carrie Durham is a 36 y.o. G2P0010 at [redacted]w[redacted]d being seen today for ongoing prenatal care.  She is currently monitored for the following issues for this low-risk pregnancy and has Supervision of normal pregnancy, antepartum and Back spasm on her problem list.  Patient reports increased pelvic pressure.  Contractions: Irregular.  .  Movement: Present. Denies leaking of fluid.   The following portions of the patient's history were reviewed and updated as appropriate: allergies, current medications, past family history, past medical history, past social history, past surgical history and problem list. Problem list updated.  Objective:   Vitals:   06/03/16 1048  BP: 115/74  Pulse: (!) 104  Weight: 159 lb (72.1 kg)    Fetal Status: Fetal Heart Rate (bpm): 147 Fundal Height: 38 cm Movement: Present     General:  Alert, oriented and cooperative. Patient is in no acute distress.  Skin: Skin is warm and dry. No rash noted.   Cardiovascular: Normal heart rate noted  Respiratory: Normal respiratory effort, no problems with respiration noted  Abdomen: Soft, gravid, appropriate for gestational age. Pain/Pressure: Present     Pelvic:  Cervical exam performed        Extremities: Normal range of motion.  Edema: None  Mental Status: Normal mood and affect. Normal behavior. Normal judgment and thought content.   Assessment and Plan:  Pregnancy: G2P0010 at [redacted]w[redacted]d  Supervision of Normal Pregnancy - Reviewed GBS results - Reviewed signs of labor and showed cervical dilation chart Term labor symptoms and general obstetric precautions including but not limited to vaginal bleeding, contractions, leaking of fluid and fetal movement were reviewed in detail with the patient. Please refer to After Visit Summary for other counseling recommendations.  Return in 1 week (on 06/10/2016).   Eino Farber Kennith Gain, CNM

## 2016-06-10 ENCOUNTER — Ambulatory Visit (INDEPENDENT_AMBULATORY_CARE_PROVIDER_SITE_OTHER): Payer: Medicaid Other | Admitting: Obstetrics and Gynecology

## 2016-06-10 ENCOUNTER — Encounter: Payer: Self-pay | Admitting: Obstetrics and Gynecology

## 2016-06-10 VITALS — BP 110/60 | HR 102 | Wt 161.0 lb

## 2016-06-10 DIAGNOSIS — O2243 Hemorrhoids in pregnancy, third trimester: Secondary | ICD-10-CM

## 2016-06-10 DIAGNOSIS — Z3493 Encounter for supervision of normal pregnancy, unspecified, third trimester: Secondary | ICD-10-CM

## 2016-06-10 DIAGNOSIS — Z3483 Encounter for supervision of other normal pregnancy, third trimester: Secondary | ICD-10-CM

## 2016-06-10 HISTORY — DX: Hemorrhoids in pregnancy, third trimester: O22.43

## 2016-06-10 MED ORDER — HYDROCORTISONE ACE-PRAMOXINE 1-1 % RE FOAM: 1.0000 | Freq: Two times a day (BID) | RECTAL | 3 refills | Status: DC

## 2016-06-10 NOTE — Progress Notes (Signed)
   PRENATAL VISIT NOTE  Subjective:  Carrie Durham is a 36 y.o. G2P0010 at [redacted]w[redacted]d being seen today for ongoing prenatal care.  She is currently monitored for the following issues for this low-risk pregnancy and has Supervision of normal pregnancy, antepartum; Back spasm; and Hemorrhoids during pregnancy in third trimester on her problem list.  Patient reports backache, occasional contractions and hemorrhoidal pain.  Contractions: Irritability. Vag. Bleeding: None.  Movement: Present. Denies leaking of fluid.   The following portions of the patient's history were reviewed and updated as appropriate: allergies, current medications, past family history, past medical history, past social history, past surgical history and problem list. Problem list updated.  Objective:   Vitals:   06/10/16 1030  BP: 110/60  Pulse: (!) 102  Weight: 161 lb (73 kg)    Fetal Status: Fetal Heart Rate (bpm): 155 Fundal Height: 37 cm Movement: Present  Presentation: Vertex  General:  Alert, oriented and cooperative. Patient is in no acute distress.  Skin: Skin is warm and dry. No rash noted.   Cardiovascular: Normal heart rate noted  Respiratory: Normal respiratory effort, no problems with respiration noted  Abdomen: Soft, gravid, appropriate for gestational age. Pain/Pressure: Present     Pelvic:  Cervical exam performed Dilation: Closed Effacement (%): 60 Station: -3  Extremities: Normal range of motion.  Edema: None  Mental Status: Normal mood and affect. Normal behavior. Normal judgment and thought content.  Moderately sized external hemorrhoids with no redness or bleeding  Assessment and Plan:  Pregnancy: G2P0010 at [redacted]w[redacted]d  1. Encounter for supervision of normal pregnancy in third trimester, unspecified gravidity   2. Hemorrhoids during pregnancy in third trimester - Educated on eating a high fiber diet and increasing water intake to soften stool and decrease size of hemorrhoids -  hydrocortisone-pramoxine (PROCTOFOAM HC) rectal foam; Place 1 applicator rectally 2 (two) times daily.  Dispense: 10 g; Refill: 3  Term labor symptoms and general obstetric precautions including but not limited to vaginal bleeding, contractions, leaking of fluid and fetal movement were reviewed in detail with the patient. Please refer to After Visit Summary for other counseling recommendations.  Return in about 1 week (around 06/17/2016) for Return OB - [facility].   Raelyn Mora, CNM

## 2016-06-10 NOTE — Patient Instructions (Addendum)
Fetal Movement Counts Patient Name: ________________________________________________ Patient Due Date: ____________________ What is a fetal movement count? A fetal movement count is the number of times that you feel your baby move during a certain amount of time. This may also be called a fetal kick count. A fetal movement count is recommended for every pregnant woman. You may be asked to start counting fetal movements as early as week 28 of your pregnancy. Pay attention to when your baby is most active. You may notice your baby's sleep and wake cycles. You may also notice things that make your baby move more. You should do a fetal movement count:  When your baby is normally most active.  At the same time each day. A good time to count movements is while you are resting, after having something to eat and drink. How do I count fetal movements? 1. Find a quiet, comfortable area. Sit, or lie down on your side. 2. Write down the date, the start time and stop time, and the number of movements that you felt between those two times. Take this information with you to your health care visits. 3. For 2 hours, count kicks, flutters, swishes, rolls, and jabs. You should feel at least 10 movements during 2 hours. 4. You may stop counting after you have felt 10 movements. 5. If you do not feel 10 movements in 2 hours, have something to eat and drink. Then, keep resting and counting for 1 hour. If you feel at least 4 movements during that hour, you may stop counting. Contact a health care provider if:  You feel fewer than 4 movements in 2 hours.  Your baby is not moving like he or she usually does. Date: ____________ Start time: ____________ Stop time: ____________ Movements: ____________ Date: ____________ Start time: ____________ Stop time: ____________ Movements: ____________ Date: ____________ Start time: ____________ Stop time: ____________ Movements: ____________ Date: ____________ Start time:  ____________ Stop time: ____________ Movements: ____________ Date: ____________ Start time: ____________ Stop time: ____________ Movements: ____________ Date: ____________ Start time: ____________ Stop time: ____________ Movements: ____________ Date: ____________ Start time: ____________ Stop time: ____________ Movements: ____________ Date: ____________ Start time: ____________ Stop time: ____________ Movements: ____________ Date: ____________ Start time: ____________ Stop time: ____________ Movements: ____________ This information is not intended to replace advice given to you by your health care provider. Make sure you discuss any questions you have with your health care provider. Document Released: 03/02/2006 Document Revised: 09/30/2015 Document Reviewed: 03/12/2015 Elsevier Interactive Patient Education  2017 [employer] of the uterus can occur throughout pregnancy, but they are not always a sign that you are in labor. You may have practice contractions called Braxton Hicks contractions. These false labor contractions are sometimes confused with true labor. What are [place] contractions? Braxton Hicks contractions are tightening movements that occur in the muscles of the uterus before labor. Unlike true labor contractions, these contractions do not result in opening (dilation) and thinning of the cervix. Toward the end of pregnancy (32-34 weeks), Braxton Hicks contractions can happen more often and may become stronger. These contractions are sometimes difficult to tell apart from true labor because they can be very uncomfortable. You should not feel embarrassed if you go to the hospital with false labor. Sometimes, the only way to tell if you are in true labor is for your health care provider to look for changes in the cervix. The health care provider will do a physical exam and may monitor your contractions. If you  are not in true labor, the exam  should show that your cervix is not dilating and your water has not broken. If there are no prenatal problems or other health problems associated with your pregnancy, it is completely safe for you to be sent home with false labor. You may continue to have Braxton Hicks contractions until you go into true labor. How can I tell the difference between true labor and false labor?  Differences  False labor  Contractions last 30-70 seconds.: Contractions are usually shorter and not as strong as true labor contractions.  Contractions become very regular.: Contractions are usually irregular.  Discomfort is usually felt in the top of the uterus, and it spreads to the lower abdomen and low back.: Contractions are often felt in the front of the lower abdomen and in the groin.  Contractions do not go away with walking.: Contractions may go away when you walk around or change positions while lying down.  Contractions usually become more intense and increase in frequency.: Contractions get weaker and are shorter-lasting as time goes on.  The cervix dilates and gets thinner.: The cervix usually does not dilate or become thin. Follow these instructions at home:  Take over-the-counter and prescription medicines only as told by your health care provider.  Keep up with your usual exercises and follow other instructions from your health care provider.  Eat and drink lightly if you think you are going into labor.  If Braxton Hicks contractions are making you uncomfortable:  Change your position from lying down or resting to walking, or change from walking to resting.  Sit and rest in a tub of warm water.  Drink enough fluid to keep your urine clear or pale yellow. Dehydration may cause these contractions.  Do slow and deep breathing several times an hour.  Keep all follow-up prenatal visits as told by your health care provider. This is important. Contact a health care provider if:  You have a  fever.  You have continuous pain in your abdomen. Get help right away if:  Your contractions become stronger, more regular, and closer together.  You have fluid leaking or gushing from your vagina.  You pass blood-tinged mucus (bloody show).  You have bleeding from your vagina.  You have low back pain that you never had before.  You feel your baby's head pushing down and causing pelvic pressure.  Your baby is not moving inside you as much as it used to. Summary  Contractions that occur before labor are called Braxton Hicks contractions, false labor, or practice contractions.  Braxton Hicks contractions are usually shorter, weaker, farther apart, and less regular than true labor contractions. True labor contractions usually become progressively stronger and regular and they become more frequent.  Manage discomfort from [facility] contractions by changing position, resting in a warm bath, drinking plenty of water, or practicing deep breathing. This information is not intended to replace advice given to you by your health care provider. Make sure you discuss any questions you have with your health care provider. Document Released: 01/31/2005 Document Revised: 12/21/2015 Document Reviewed: 12/21/2015 Elsevier Interactive Patient Education  2017 [employer]  Hemorrhoids Hemorrhoids are swollen veins in and around the rectum or anus. There are two types of hemorrhoids:  Internal hemorrhoids. These occur in the veins that are just inside the rectum. They may poke through to the outside and become irritated and painful.  External hemorrhoids. These occur in the veins that are outside of the anus and  can be felt as a painful swelling or hard lump near the anus. Most hemorrhoids do not cause serious problems, and they can be managed with home treatments such as diet and lifestyle changes. If home treatments do not help your symptoms, procedures can be done to shrink or remove the  hemorrhoids. What are the causes? This condition is caused by increased pressure in the anal area. This pressure may result from various things, including:  Constipation.  Straining to have a bowel movement.  Diarrhea.  Pregnancy.  Obesity.  Sitting for long periods of time.  Heavy lifting or other activity that causes you to strain.  Anal sex. What are the signs or symptoms? Symptoms of this condition include:  Pain.  Anal itching or irritation.  Rectal bleeding.  Leakage of stool (feces).  Anal swelling.  One or more lumps around the anus. How is this diagnosed? This condition can often be diagnosed through a visual exam. Other exams or tests may also be done, such as:  Examination of the rectal area with a gloved hand (digital rectal exam).  Examination of the anal canal using a small tube (anoscope).  A blood test, if you have lost a significant amount of blood.  A test to look inside the colon (sigmoidoscopy or colonoscopy). How is this treated? This condition can usually be treated at home. However, various procedures may be done if dietary changes, lifestyle changes, and other home treatments do not help your symptoms. These procedures can help make the hemorrhoids smaller or remove them completely. Some of these procedures involve surgery, and others do not. Common procedures include:  Rubber band ligation. Rubber bands are placed at the base of the hemorrhoids to cut off the blood supply to them.  Sclerotherapy. Medicine is injected into the hemorrhoids to shrink them.  Infrared coagulation. A type of light energy is used to get rid of the hemorrhoids.  Hemorrhoidectomy surgery. The hemorrhoids are surgically removed, and the veins that supply them are tied off.  Stapled hemorrhoidopexy surgery. A circular stapling device is used to remove the hemorrhoids and use staples to cut off the blood supply to them. Follow these instructions at home: Eating and  drinking   Eat foods that have a lot of fiber in them, such as whole grains, beans, nuts, fruits, and vegetables. Ask your health care provider about taking products that have added fiber (fiber supplements).  Drink enough fluid to keep your urine clear or pale yellow. Managing pain and swelling   Take warm sitz baths for 20 minutes, 3-4 times a day to ease pain and discomfort.  If directed, apply ice to the affected area. Using ice packs between sitz baths may be helpful.  Put ice in a plastic bag.  Place a towel between your skin and the bag.  Leave the ice on for 20 minutes, 2-3 times a day. General instructions   Take over-the-counter and prescription medicines only as told by your health care provider.  Use medicated creams or suppositories as told.  Exercise regularly.  Go to the bathroom when you have the urge to have a bowel movement. Do not wait.  Avoid straining to have bowel movements.  Keep the anal area dry and clean. Use wet toilet paper or moist towelettes after a bowel movement.  Do not sit on the toilet for long periods of time. This increases blood pooling and pain. Contact a health care provider if:  You have increasing pain and swelling that are not  controlled by treatment or medicine.  You have uncontrolled bleeding.  You have difficulty having a bowel movement, or you are unable to have a bowel movement.  You have pain or inflammation outside the area of the hemorrhoids. This information is not intended to replace advice given to you by your health care provider. Make sure you discuss any questions you have with your health care provider. Document Released: 01/29/2000 Document Revised: 07/01/2015 Document Reviewed: 10/15/2014 Elsevier Interactive Patient Education  2017 [employer].

## 2016-06-17 ENCOUNTER — Ambulatory Visit (INDEPENDENT_AMBULATORY_CARE_PROVIDER_SITE_OTHER): Payer: Medicaid Other | Admitting: Advanced Practice Midwife

## 2016-06-17 VITALS — BP 105/67 | HR 90 | Wt 161.0 lb

## 2016-06-17 DIAGNOSIS — Z3493 Encounter for supervision of normal pregnancy, unspecified, third trimester: Secondary | ICD-10-CM

## 2016-06-17 DIAGNOSIS — Z3483 Encounter for supervision of other normal pregnancy, third trimester: Secondary | ICD-10-CM

## 2016-06-20 NOTE — Patient Instructions (Signed)
Third Trimester of Pregnancy The third trimester is from week 28 through week 40 (months 7 through 9). The third trimester is a time when the unborn baby (fetus) is growing rapidly. At the end of the ninth month, the fetus is about 20 inches in length and weighs 6-10 pounds. Body changes during your third trimester Your body will continue to go through many changes during pregnancy. The changes vary from woman to woman. During the third trimester:  Your weight will continue to increase. You can expect to gain 25-35 pounds (11-16 kg) by the end of the pregnancy.  You may begin to get stretch marks on your hips, abdomen, and breasts.  You may urinate more often because the fetus is moving lower into your pelvis and pressing on your bladder.  You may develop or continue to have heartburn. This is caused by increased hormones that slow down muscles in the digestive tract.  You may develop or continue to have constipation because increased hormones slow digestion and cause the muscles that push waste through your intestines to relax.  You may develop hemorrhoids. These are swollen veins (varicose veins) in the rectum that can itch or be painful.  You may develop swollen, bulging veins (varicose veins) in your legs.  You may have increased body aches in the pelvis, back, or thighs. This is due to weight gain and increased hormones that are relaxing your joints.  You may have changes in your hair. These can include thickening of your hair, rapid growth, and changes in texture. Some women also have hair loss during or after pregnancy, or hair that feels dry or thin. Your hair will most likely return to normal after your baby is born.  Your breasts will continue to grow and they will continue to become tender. A yellow fluid (colostrum) may leak from your breasts. This is the first milk you are producing for your baby.  Your belly button may stick out.  You may notice more swelling in your hands,  face, or ankles.  You may have increased tingling or numbness in your hands, arms, and legs. The skin on your belly may also feel numb.  You may feel short of breath because of your expanding uterus.  You may have more problems sleeping. This can be caused by the size of your belly, increased need to urinate, and an increase in your body's metabolism.  You may notice the fetus "dropping," or moving lower in your abdomen (lightening).  You may have increased vaginal discharge.  You may notice your joints feel loose and you may have pain around your pelvic bone.  What to expect at prenatal visits You will have prenatal exams every 2 weeks until week 36. Then you will have weekly prenatal exams. During a routine prenatal visit:  You will be weighed to make sure you and the baby are growing normally.  Your blood pressure will be taken.  Your abdomen will be measured to track your baby's growth.  The fetal heartbeat will be listened to.  Any test results from the previous visit will be discussed.  You may have a cervical check near your due date to see if your cervix has softened or thinned (effaced).  You will be tested for Group B streptococcus. This happens between 35 and 37 weeks.  Your health care provider may ask you:  What your birth plan is.  How you are feeling.  If you are feeling the baby move.  If you have had   any abnormal symptoms, such as leaking fluid, bleeding, severe headaches, or abdominal cramping.  If you are using any tobacco products, including cigarettes, chewing tobacco, and electronic cigarettes.  If you have any questions.  Other tests or screenings that may be performed during your third trimester include:  Blood tests that check for low iron levels (anemia).  Fetal testing to check the health, activity level, and growth of the fetus. Testing is done if you have certain medical conditions or if there are problems during the  pregnancy.  Nonstress test (NST). This test checks the health of your baby to make sure there are no signs of problems, such as the baby not getting enough oxygen. During this test, a belt is placed around your belly. The baby is made to move, and its heart rate is monitored during movement.  What is false labor? False labor is a condition in which you feel small, irregular tightenings of the muscles in the womb (contractions) that usually go away with rest, changing position, or drinking water. These are called Braxton Hicks contractions. Contractions may last for hours, days, or even weeks before true labor sets in. If contractions come at regular intervals, become more frequent, increase in intensity, or become painful, you should see your health care provider. What are the signs of labor?  Abdominal cramps.  Regular contractions that start at 10 minutes apart and become stronger and more frequent with time.  Contractions that start on the top of the uterus and spread down to the lower abdomen and back.  Increased pelvic pressure and dull back pain.  A watery or bloody mucus discharge that comes from the vagina.  Leaking of amniotic fluid. This is also known as your "water breaking." It could be a slow trickle or a gush. Let your health care provider know if it has a color or strange odor. If you have any of these signs, call your health care provider right away, even if it is before your due date. Follow these instructions at home: Medicines  Follow your health care provider's instructions regarding medicine use. Specific medicines may be either safe or unsafe to take during pregnancy.  Take a prenatal vitamin that contains at least 600 micrograms (mcg) of folic acid.  If you develop constipation, try taking a stool softener if your health care provider approves. Eating and drinking  Eat a balanced diet that includes fresh fruits and vegetables, whole grains, good sources of protein  such as meat, eggs, or tofu, and low-fat dairy. Your health care provider will help you determine the amount of weight gain that is right for you.  Avoid raw meat and uncooked cheese. These carry germs that can cause birth defects in the baby.  If you have low calcium intake from food, talk to your health care provider about whether you should take a daily calcium supplement.  Eat four or five small meals rather than three large meals a day.  Limit foods that are high in fat and processed sugars, such as fried and sweet foods.  To prevent constipation: ? Drink enough fluid to keep your urine clear or pale yellow. ? Eat foods that are high in fiber, such as fresh fruits and vegetables, whole grains, and beans. Activity  Exercise only as directed by your health care provider. Most women can continue their usual exercise routine during pregnancy. Try to exercise for 30 minutes at least 5 days a week. Stop exercising if you experience uterine contractions.  Avoid heavy   lifting.  Do not exercise in extreme heat or humidity, or at high altitudes.  Wear low-heel, comfortable shoes.  Practice good posture.  You may continue to have sex unless your health care provider tells you otherwise. Relieving pain and discomfort  Take frequent breaks and rest with your legs elevated if you have leg cramps or low back pain.  Take warm sitz baths to soothe any pain or discomfort caused by hemorrhoids. Use hemorrhoid cream if your health care provider approves.  Wear a good support bra to prevent discomfort from breast tenderness.  If you develop varicose veins: ? Wear support pantyhose or compression stockings as told by your healthcare provider. ? Elevate your feet for 15 minutes, 3-4 times a day. Prenatal care  Write down your questions. Take them to your prenatal visits.  Keep all your prenatal visits as told by your health care provider. This is important. Safety  Wear your seat belt at  all times when driving.  Make a list of emergency phone numbers, including numbers for family, friends, the hospital, and police and fire departments. General instructions  Avoid cat litter boxes and soil used by cats. These carry germs that can cause birth defects in the baby. If you have a cat, ask someone to clean the litter box for you.  Do not travel far distances unless it is absolutely necessary and only with the approval of your health care provider.  Do not use hot tubs, steam rooms, or saunas.  Do not drink alcohol.  Do not use any products that contain nicotine or tobacco, such as cigarettes and e-cigarettes. If you need help quitting, ask your health care provider.  Do not use any medicinal herbs or unprescribed drugs. These chemicals affect the formation and growth of the baby.  Do not douche or use tampons or scented sanitary pads.  Do not cross your legs for long periods of time.  To prepare for the arrival of your baby: ? Take prenatal classes to understand, practice, and ask questions about labor and delivery. ? Make a trial run to the hospital. ? Visit the hospital and tour the maternity area. ? Arrange for maternity or paternity leave through employers. ? Arrange for family and friends to take care of pets while you are in the hospital. ? Purchase a rear-facing car seat and make sure you know how to install it in your car. ? Pack your hospital bag. ? Prepare the baby's nursery. Make sure to remove all pillows and stuffed animals from the baby's crib to prevent suffocation.  Visit your dentist if you have not gone during your pregnancy. Use a soft toothbrush to brush your teeth and be gentle when you floss. Contact a health care provider if:  You are unsure if you are in labor or if your water has broken.  You become dizzy.  You have mild pelvic cramps, pelvic pressure, or nagging pain in your abdominal area.  You have lower back pain.  You have persistent  nausea, vomiting, or diarrhea.  You have an unusual or bad smelling vaginal discharge.  You have pain when you urinate. Get help right away if:  Your water breaks before 37 weeks.  You have regular contractions less than 5 minutes apart before 37 weeks.  You have a fever.  You are leaking fluid from your vagina.  You have spotting or bleeding from your vagina.  You have severe abdominal pain or cramping.  You have rapid weight loss or weight gain.    You have shortness of breath with chest pain.  You notice sudden or extreme swelling of your face, hands, ankles, feet, or legs.  Your baby makes fewer than 10 movements in 2 hours.  You have severe headaches that do not go away when you take medicine.  You have vision changes. Summary  The third trimester is from week 28 through week 40, months 7 through 9. The third trimester is a time when the unborn baby (fetus) is growing rapidly.  During the third trimester, your discomfort may increase as you and your baby continue to gain weight. You may have abdominal, leg, and back pain, sleeping problems, and an increased need to urinate.  During the third trimester your breasts will keep growing and they will continue to become tender. A yellow fluid (colostrum) may leak from your breasts. This is the first milk you are producing for your baby.  False labor is a condition in which you feel small, irregular tightenings of the muscles in the womb (contractions) that eventually go away. These are called Braxton Hicks contractions. Contractions may last for hours, days, or even weeks before true labor sets in.  Signs of labor can include: abdominal cramps; regular contractions that start at 10 minutes apart and become stronger and more frequent with time; watery or bloody mucus discharge that comes from the vagina; increased pelvic pressure and dull back pain; and leaking of amniotic fluid. This information is not intended to replace advice  given to you by your health care provider. Make sure you discuss any questions you have with your health care provider. Document Released: 01/25/2001 Document Revised: 07/09/2015 Document Reviewed: 04/03/2012 [employer] Interactive Patient Education  2017 [employer]  

## 2016-06-20 NOTE — Progress Notes (Signed)
   PRENATAL VISIT NOTE  Subjective:  Carrie Durham is a 36 y.o. G2P0010 at 4665w3d being seen today for ongoing prenatal care.  She is currently monitored for the following issues for this low-risk pregnancy and has Supervision of normal pregnancy, antepartum; Back spasm; and Hemorrhoids during pregnancy in third trimester on her problem list.  Patient reports occasional contractions.  Contractions: Irritability. Vag. Bleeding: None.  Movement: Present. Denies leaking of fluid.   The following portions of the patient's history were reviewed and updated as appropriate: allergies, current medications, past family history, past medical history, past social history, past surgical history and problem list. Problem list updated.  Objective:   Vitals:   06/17/16 1058  BP: 105/67  Pulse: 90  Weight: 161 lb (73 kg)    Fetal Status: Fetal Heart Rate (bpm): 145   Movement: Present     General:  Alert, oriented and cooperative. Patient is in no acute distress.  Skin: Skin is warm and dry. No rash noted.   Cardiovascular: Normal heart rate noted  Respiratory: Normal respiratory effort, no problems with respiration noted  Abdomen: Soft, gravid, appropriate for gestational age. Pain/Pressure: Present     Pelvic:  Cervical exam deferred        Extremities: Normal range of motion.  Edema: Trace  Mental Status: Normal mood and affect. Normal behavior. Normal judgment and thought content.   Assessment and Plan:  Pregnancy: G2P0010 at 1465w3d  There are no diagnoses linked to this encounter. Term labor symptoms and general obstetric precautions including but not limited to vaginal bleeding, contractions, leaking of fluid and fetal movement were reviewed in detail with the patient. Please refer to After Visit Summary for other counseling recommendations.  RTO 1 week Will schedule induction if still pregnant next week   Aviva SignsWilliams, Marie L, CNM

## 2016-06-24 ENCOUNTER — Encounter: Payer: Self-pay | Admitting: *Deleted

## 2016-06-24 ENCOUNTER — Ambulatory Visit (INDEPENDENT_AMBULATORY_CARE_PROVIDER_SITE_OTHER): Payer: Medicaid Other | Admitting: Obstetrics and Gynecology

## 2016-06-24 VITALS — BP 106/72 | HR 101 | Wt 162.0 lb

## 2016-06-24 DIAGNOSIS — Z3493 Encounter for supervision of normal pregnancy, unspecified, third trimester: Secondary | ICD-10-CM

## 2016-06-24 DIAGNOSIS — Z3483 Encounter for supervision of other normal pregnancy, third trimester: Secondary | ICD-10-CM

## 2016-06-24 NOTE — Progress Notes (Signed)
   PRENATAL VISIT NOTE  Subjective:  Carrie Durham is a 36 y.o. G2P0010 at 4331w0d being seen today for ongoing prenatal care.  She is currently monitored for the following issues for this low-risk pregnancy and has Supervision of normal pregnancy, antepartum; Back spasm; and Hemorrhoids during pregnancy in third trimester on her problem list.  Patient reports occasional contractions.  Contractions: Irritability. Vag. Bleeding: None.  Movement: Present. Denies leaking of fluid.   The following portions of the patient's history were reviewed and updated as appropriate: allergies, current medications, past family history, past medical history, past social history, past surgical history and problem list. Problem list updated.  Objective:   Vitals:   06/24/16 1050  BP: 106/72  Pulse: (!) 101  Weight: 162 lb (73.5 kg)    Fetal Status: Fetal Heart Rate (bpm): 146 Fundal Height: 42 cm Movement: Present  Presentation: Vertex  General:  Alert, oriented and cooperative. Patient is in no acute distress.  Skin: Skin is warm and dry. No rash noted.   Cardiovascular: Normal heart rate noted  Respiratory: Normal respiratory effort, no problems with respiration noted  Abdomen: Soft, gravid, appropriate for gestational age. Pain/Pressure: Present     Pelvic:  Cervical exam performed Dilation: Closed Effacement (%): 50 Station: -3  Extremities: Normal range of motion.  Edema: Trace  Mental Status: Normal mood and affect. Normal behavior. Normal judgment and thought content.   Assessment and Plan:  Pregnancy: G2P0010 at 6731w0d  1. Encounter for supervision of normal pregnancy in third trimester, unspecified gravidity - IOL scheduled for 07/02/2016 at midnight  Term labor symptoms and general obstetric precautions including but not limited to vaginal bleeding, contractions, leaking of fluid and fetal movement were reviewed in detail with the patient. Please refer to After Visit Summary for other  counseling recommendations.  Return in about 1 week (around 07/01/2016) for Return OB - K[facility], NST (for postdates).   Raelyn Moraawson, Jakiya Bookbinder, CNM

## 2016-06-24 NOTE — Patient Instructions (Signed)
Braxton Hicks Contractions Contractions of the uterus can occur throughout pregnancy, but they are not always a sign that you are in labor. You may have practice contractions called Braxton Hicks contractions. These false labor contractions are sometimes confused with true labor. What are Braxton Hicks contractions? Braxton Hicks contractions are tightening movements that occur in the muscles of the uterus before labor. Unlike true labor contractions, these contractions do not result in opening (dilation) and thinning of the cervix. Toward the end of pregnancy (32-34 weeks), Braxton Hicks contractions can happen more often and may become stronger. These contractions are sometimes difficult to tell apart from true labor because they can be very uncomfortable. You should not feel embarrassed if you go to the hospital with false labor. Sometimes, the only way to tell if you are in true labor is for your health care provider to look for changes in the cervix. The health care provider will do a physical exam and may monitor your contractions. If you are not in true labor, the exam should show that your cervix is not dilating and your water has not broken. If there are no prenatal problems or other health problems associated with your pregnancy, it is completely safe for you to be sent home with false labor. You may continue to have Braxton Hicks contractions until you go into true labor. How can I tell the difference between true labor and false labor?  Differences ? False labor ? Contractions last 30-70 seconds.: Contractions are usually shorter and not as strong as true labor contractions. ? Contractions become very regular.: Contractions are usually irregular. ? Discomfort is usually felt in the top of the uterus, and it spreads to the lower abdomen and low back.: Contractions are often felt in the front of the lower abdomen and in the groin. ? Contractions do not go away with walking.: Contractions may  go away when you walk around or change positions while lying down. ? Contractions usually become more intense and increase in frequency.: Contractions get weaker and are shorter-lasting as time goes on. ? The cervix dilates and gets thinner.: The cervix usually does not dilate or become thin. Follow these instructions at home:  Take over-the-counter and prescription medicines only as told by your health care provider.  Keep up with your usual exercises and follow other instructions from your health care provider.  Eat and drink lightly if you think you are going into labor.  If Braxton Hicks contractions are making you uncomfortable: ? Change your position from lying down or resting to walking, or change from walking to resting. ? Sit and rest in a tub of warm water. ? Drink enough fluid to keep your urine clear or pale yellow. Dehydration may cause these contractions. ? Do slow and deep breathing several times an hour.  Keep all follow-up prenatal visits as told by your health care provider. This is important. Contact a health care provider if:  You have a fever.  You have continuous pain in your abdomen. Get help right away if:  Your contractions become stronger, more regular, and closer together.  You have fluid leaking or gushing from your vagina.  You pass blood-tinged mucus (bloody show).  You have bleeding from your vagina.  You have low back pain that you never had before.  You feel your baby's head pushing down and causing pelvic pressure.  Your baby is not moving inside you as much as it used to. Summary  Contractions that occur before labor are   called Braxton Hicks contractions, false labor, or practice contractions. °¨ Braxton Hicks contractions are usually shorter, weaker, farther apart, and less regular than true labor contractions. True labor contractions usually become progressively stronger and regular and they become more frequent. °¨ Manage discomfort from  Braxton Hicks contractions by changing position, resting in a warm bath, drinking plenty of water, or practicing deep breathing. °This information is not intended to replace advice given to you by your health care provider. Make sure you discuss any questions you have with your health care provider. °Document Released: 01/31/2005 Document Revised: 12/21/2015 Document Reviewed: 12/21/2015 °Elsevier Interactive Patient Education © 2017 [employer] Inc. ° ° °Labor Induction °Labor induction is when steps are taken to cause a pregnant woman to begin the labor process. Most women go into labor on their own between 37 weeks and 42 weeks of the pregnancy. When this does not happen or when there is a medical need, methods may be used to induce labor. Labor induction causes a pregnant woman's uterus to contract. It also causes the cervix to soften (ripen), open (dilate), and thin out (efface). Usually, labor is not induced before 39 weeks of the pregnancy unless there is a problem with the baby or mother. °Before inducing labor, your health care provider will consider a number of factors, including the following: °· The medical condition of you and the baby. °· How many weeks along you are. °· The status of the baby’s lung maturity. °· The condition of the cervix. °· The position of the baby. °What are the reasons for labor induction? °Labor may be induced for the following reasons: °· The health of the baby or mother is at risk. °· The pregnancy is overdue by 1 week or more. °· The water breaks but labor does not start on its own. °· The mother has a health condition or serious illness, such as high blood pressure, infection, placental abruption, or diabetes. °· The amniotic fluid amounts are low around the baby. °· The baby is distressed. °Convenience or wanting the baby to be born on a certain date is not a reason for inducing labor. °What methods are used for labor induction? °Several methods of labor induction may be used,  such as: °· Prostaglandin medicine. This medicine causes the cervix to dilate and ripen. The medicine will also start contractions. It can be taken by mouth or by inserting a suppository into the vagina. °· Inserting a thin tube (catheter) with a balloon on the end into the vagina to dilate the cervix. Once inserted, the balloon is expanded with water, which causes the cervix to open. °· Stripping the membranes. Your health care provider separates amniotic sac tissue from the cervix, causing the cervix to be stretched and causing the release of a hormone called progesterone. This may cause the uterus to contract. It is often done during an office visit. You will be sent home to wait for the contractions to begin. You will then come in for an induction. °· Breaking the water. Your health care provider makes a hole in the amniotic sac using a small instrument. Once the amniotic sac breaks, contractions should begin. This may still take hours to see an effect. °· Medicine to trigger or strengthen contractions. This medicine is given through an IV access tube inserted into a vein in your arm. °All of the methods of induction, besides stripping the membranes, will be done in the hospital. Induction is done in the hospital so that you and the baby   carefully monitored. How long does it take for labor to be induced? Some inductions can take up to 2-3 days. Depending on the cervix, it usually takes less time. It takes longer when you are induced early in the pregnancy or if this is your first pregnancy. If a mother is still pregnant and the induction has been going on for 2-3 days, either the mother will be sent home or a cesarean delivery will be needed. What are the risks associated with labor induction? Some of the risks of induction include:  Changes in fetal heart rate, such as too high, too low, or erratic.  Fetal distress.  Chance of infection for the mother and baby.  Increased chance of having a  cesarean delivery.  Breaking off (abruption) of the placenta from the uterus (rare).  Uterine rupture (very rare). When induction is needed for medical reasons, the benefits of induction may outweigh the risks. What are some reasons for not inducing labor? Labor induction should not be done if:  It is shown that your baby does not tolerate labor.  You have had previous surgeries on your uterus, such as a myomectomy or the removal of fibroids.  Your placenta lies very low in the uterus and blocks the opening of the cervix (placenta previa).  Your baby is not in a head-down position.  The umbilical cord drops down into the birth canal in front of the baby. This could cut off the baby's blood and oxygen supply.  You have had a previous cesarean delivery.  There are unusual circumstances, such as the baby being extremely premature. This information is not intended to replace advice given to you by your health care provider. Make sure you discuss any questions you have with your health care provider. Document Released: 06/22/2006 Document Revised: 07/09/2015 Document Reviewed: 08/30/2012 Elsevier Interactive Patient Education  2017 [employer]

## 2016-06-27 ENCOUNTER — Encounter (HOSPITAL_COMMUNITY): Payer: Self-pay | Admitting: *Deleted

## 2016-06-27 ENCOUNTER — Inpatient Hospital Stay (HOSPITAL_COMMUNITY)
Admission: AD | Admit: 2016-06-27 | Discharge: 2016-06-27 | Disposition: A | Discharge status: Home / Self Care | Payer: Medicaid Other | Source: Ambulatory Visit | Attending: Obstetrics and Gynecology | Admitting: Obstetrics and Gynecology

## 2016-06-27 DIAGNOSIS — O9989 Other specified diseases and conditions complicating pregnancy, childbirth and the puerperium: Secondary | ICD-10-CM | POA: Diagnosis not present

## 2016-06-27 DIAGNOSIS — Z3A40 40 weeks gestation of pregnancy: Secondary | ICD-10-CM | POA: Diagnosis not present

## 2016-06-27 DIAGNOSIS — O480 Post-term pregnancy: Secondary | ICD-10-CM | POA: Insufficient documentation

## 2016-06-27 DIAGNOSIS — M62830 Muscle spasm of back: Secondary | ICD-10-CM

## 2016-06-27 DIAGNOSIS — O2243 Hemorrhoids in pregnancy, third trimester: Secondary | ICD-10-CM | POA: Diagnosis not present

## 2016-06-27 MED ORDER — NITROGLYCERIN 2 % TD OINT: 0.5000 [in_us] | TOPICAL_OINTMENT | Freq: Three times a day (TID) | TRANSDERMAL | 3 refills | Status: DC

## 2016-06-27 NOTE — MAU Note (Signed)
Patient reports having rectal pain times two weeks.

## 2016-06-27 NOTE — MAU Provider Note (Signed)
History     CSN: [id number]  Arrival date and time: 06/27/16 1315   First Provider Initiated Contact with Patient 06/27/16 1552      Chief Complaint  Patient presents with  . Hemorrhoids   HPI  [name] is a 36 yo G2P0010 at 40.[redacted] wks gestation by LMP presenting with complaints of hemorrhoidal pain and contractions.  The pain from both kept her from sleeping last night. She reports her contractions are about every 10 mins. She took Proctofoam cream for about a month, but is out of Rx.  She states the Proctofoam did not work. Denies VB, LOF or bleeding from hemorrhoid.  Past Medical History:  Diagnosis Date  . Hemorrhoids during pregnancy in third trimester 06/10/2016  . Medical history non-contributory     Past Surgical History:  Procedure Laterality Date  . NO PAST SURGERIES      History reviewed. No pertinent family history.  Social History  Substance Use Topics  . Smoking status: Never Smoker  . Smokeless tobacco: Never Used  . Alcohol use No    Allergies: No Known Allergies  Prescriptions Prior to Admission  Medication Sig Dispense Refill Last Dose  . ferrous sulfate (FERROUSUL) 325 (65 FE) MG tablet Take 1 tablet (325 mg total) by mouth daily with breakfast. 60 tablet 1 Past Month at Unknown time  . hydrocortisone-pramoxine (PROCTOFOAM HC) rectal foam Place 1 applicator rectally 2 (two) times daily. 10 g 3 Past Month at Unknown time  . ondansetron (ZOFRAN) 4 MG tablet Take 1 tablet (4 mg total) by mouth every 8 (eight) hours as needed for nausea or vomiting. 20 tablet 0 Past Month at Unknown time  . pantoprazole (PROTONIX) 20 MG tablet Take 1 tablet (20 mg total) by mouth daily. 30 tablet 1 Past Month at Unknown time  . Prenat-FeFum-FePo-FA-Omega 3 (CONCEPT DHA) 53.5-38-1 MG CAPS Take 1 tablet by mouth daily. 30 capsule 2 Past Week at Unknown time  . cyclobenzaprine (FLEXERIL) 5 MG tablet Take 1 tablet (5 mg total) by mouth at bedtime as needed for muscle  spasms. (Patient not taking: Reported on 06/27/2016) 30 tablet 0 Not Taking at Unknown time    Review of Systems  Constitutional: Negative.   HENT: Negative.   Eyes: Negative.   Respiratory: Negative.   Cardiovascular: Negative.   Gastrointestinal: Positive for abdominal pain and rectal pain (hemorrhoids).  Endocrine: Negative.   Genitourinary: Positive for pelvic pain (contractions every 10 mins).  Musculoskeletal: Negative.   Skin: Negative.   Allergic/Immunologic: Negative.   Neurological: Negative.   Hematological: Negative.   Psychiatric/Behavioral: Negative.    Physical Exam   Blood pressure 114/76, pulse 75, last menstrual period 09/18/2015.  Physical Exam  Constitutional: She is oriented to person, place, and time. She appears well-developed and well-nourished.  HENT:  Head: Normocephalic.  Eyes: Pupils are equal, round, and reactive to light.  Neck: Normal range of motion.  Cardiovascular: Normal rate, regular rhythm, normal heart sounds and intact distal pulses.   Respiratory: Effort normal and breath sounds normal.  GI: Soft. Bowel sounds are normal.  Genitourinary: Rectal exam shows external hemorrhoid.     Genitourinary Comments: Gravid, cx= closed/50%/-3/vtx/posterior  Musculoskeletal: Normal range of motion.  Neurological: She is alert and oriented to person, place, and time. She has normal reflexes.  Skin: Skin is warm and dry.  Psychiatric: She has a normal mood and affect. Her behavior is normal. Judgment and thought content normal.   CEFM  FHR: 125 bpm /  moderate variability / accels present / decels absent TOCO: 4 UC's with UI  MAU Course  Procedures  MDM NST Vaginal Exam Rectal Exam - visual only Rx Nitroglycerin 2% cream per consult with Dr. Genevie AnnSchenk  Assessment and Plan  36 yo G2P0010 at 40.[redacted] wks gestation Hemorrhoids during pregnancy in third trimester - Instructions on hemorrhoids - Nitroglycerin 2% cream pea sized amount to hemorrhoids  TID  Discharge  home Labor instructions given / NST appt for 06/28/2016 cancelled / Keep IOL appt on 07/02/2016 at midnight Patient verbalized an understanding of the plan of care and agrees.   Raelyn Moraolitta Jearlean Demauro MSN, CNM 06/27/2016, 3:58 PM

## 2016-06-27 NOTE — Discharge Instructions (Signed)
Normal Labor and Delivery ° °Your caregiver must first be sure you are in labor. Signs of labor include: ° °· You may pass what is called "the mucus plug" before labor begins. This is a small amount of blood stained mucus. °· Regular uterine contractions. °· The time between contractions get closer together. °· The discomfort and pain gradually gets more intense. °· Pains are mostly located in the back. °· Pains get worse when walking. °· The cervix (the opening of the uterus becomes thinner (begins to efface) and opens up (dilates). ° ° °Once you are in labor and admitted into the hospital or care center, your caregiver will do the following: ° °· A complete physical examination. °· Check your vital signs (blood pressure, pulse, temperature and the fetal heart rate). °· Do a vaginal examination (using a sterile glove and lubricant) to determine: °· The position (presentation) of the baby (head [vertex] or buttock first). °· The level (station) of the baby's head in the birth canal. °· The effacement and dilatation of the cervix. °· An electronic monitor is usually placed on your abdomen. The monitor follows the length and intensity of the contractions, as well as the baby's heart rate. °· your caregiver may insert an IV in your arm with a bottle of sugar water. This is done as a precaution so that medications can be given to you quickly during labor or delivery. ° ° °NORMAL LABOR AND DELIVERY IS DIVIDED UP INTO 3 STAGES: ° °First Stage °This is when regular contractions begin and the cervix begins to efface and dilate. This stage can last from 3 to 15 hours. The end of the first stage is when the cervix is 100% effaced and 10 centimeters dilated. Pain medications may be given by  °· Injection (morphine, demerol, etc.) °· Regional anesthesia (spinal, caudal or epidural, anesthetics given in different locations of the spine). Paracervical pain medication may be given, which is an injection of and anesthetic on each  side of the cervix. °A pregnant woman may request to have "Natural Childbirth" which is not to have any medications or anesthesia during her labor and delivery. ° °Second Stage °This is when the baby comes down through the birth canal (vagina) and is born. This can take 1 to 4 hours. As the baby's head comes down through the birth canal, you may feel like you are going to have a bowel movement. You will get the urge to bear down and push until the baby is delivered. As the baby's head is being delivered, the caregiver will decide if an episiotomy (a cut in the perineum and vagina area) is needed to prevent tearing of the tissue in this area. The episiotomy is sewn up after the delivery of the baby and placenta. Sometimes a mask with nitrous oxide is given for the mother to breath during the delivery of the baby to help if there is too much pain. The end of Stage 2 is when the baby is fully delivered. Then when the umbilical cord stops pulsating it is clamped and cut. ° °Third Stage °The third stage begins after the baby is completely delivered and ends after the placenta (afterbirth) is delivered. This usually takes 5 to 30 minutes. After the placenta is delivered, a medication is given either by intravenous or injection to help contract the uterus and prevent bleeding. The third stage is not painful and pain medication is usually not necessary. If there was a tear, it is repaired at this time. °  After the delivery, the mother is watched and monitored closely for 1 to 2 hours to make sure there is no postpartum bleeding (hemorrhage). If there is a lot of bleeding, medication is given to contract the uterus and stop the bleeding. ° ° °Document Released: 11/10/2007 Document Revised: 04/25/2011 Document Reviewed: 11/10/2007 °ExitCare® Patient Information ©2013 [employer]. ° ° °

## 2016-06-28 ENCOUNTER — Other Ambulatory Visit: Payer: Medicaid Other

## 2016-06-28 ENCOUNTER — Telehealth (HOSPITAL_COMMUNITY): Payer: Self-pay | Admitting: *Deleted

## 2016-06-28 ENCOUNTER — Inpatient Hospital Stay (HOSPITAL_COMMUNITY)
Admission: AD | Admit: 2016-06-28 | Discharge: 2016-06-28 | Disposition: A | Discharge status: Home / Self Care | Payer: Medicaid Other | Source: Ambulatory Visit | Attending: Obstetrics and Gynecology | Admitting: Obstetrics and Gynecology

## 2016-06-28 DIAGNOSIS — Z3A40 40 weeks gestation of pregnancy: Secondary | ICD-10-CM | POA: Diagnosis present

## 2016-06-28 DIAGNOSIS — Z79899 Other long term (current) drug therapy: Secondary | ICD-10-CM | POA: Diagnosis not present

## 2016-06-28 DIAGNOSIS — M62830 Muscle spasm of back: Secondary | ICD-10-CM | POA: Diagnosis not present

## 2016-06-28 DIAGNOSIS — O9989 Other specified diseases and conditions complicating pregnancy, childbirth and the puerperium: Secondary | ICD-10-CM | POA: Diagnosis not present

## 2016-06-28 DIAGNOSIS — O2243 Hemorrhoids in pregnancy, third trimester: Secondary | ICD-10-CM

## 2016-06-28 DIAGNOSIS — O26893 Other specified pregnancy related conditions, third trimester: Secondary | ICD-10-CM | POA: Diagnosis not present

## 2016-06-28 MED ORDER — NITROGLYCERIN 2 % TD OINT
0.5000 [in_us] | TOPICAL_OINTMENT | Freq: Four times a day (QID) | TRANSDERMAL | Status: DC
  Administered 2016-06-28: 0.5 [in_us] via TOPICAL
  Filled 2016-06-28: qty 30

## 2016-06-28 NOTE — Telephone Encounter (Signed)
Preadmission screen  

## 2016-06-28 NOTE — MAU Provider Note (Signed)
Chief Complaint:  Contractions and Hemorrhoids   First Provider Initiated Contact with Patient 06/28/16 0216      HPI: Carrie Durham is a 36 y.o. G2P0010 at [id number] who presents to maternity admissions reporting hemorrhoidal pain, unable to get medication, and contractions.  Patient was just here less than 24 hours ago due to hemorrhoid pain. She was given a Rx for nitroglycerine ointment, but her pharmacy would not prescribed it due to needed prior authorization for nitro ointment. Only given proctofoam (which has not worked for her). So she returns to MAU due to this. The hemorrhoidal pain keeps her from sleeping and laying down or sitting to get comfortable.   She is still having regular contractions. Every 5-10 min.  Denies contractions, leakage of fluid or vaginal bleeding. Good fetal movement.   Pregnancy Course:   Past Medical History: Past Medical History:  Diagnosis Date  . Hemorrhoids during pregnancy in third trimester 06/10/2016  . Medical history non-contributory     Past obstetric history: OB History  Gravida Para Term Preterm AB Living  2 0 0 0 1 0  SAB TAB Ectopic Multiple Live Births  1 0 0 0 0    # Outcome Date GA Lbr Len/2nd Weight Sex Delivery Anes PTL Lv  2 Current           1 SAB               Past Surgical History: Past Surgical History:  Procedure Laterality Date  . NO PAST SURGERIES       Family History: No family history on file.  Social History: Social History  Substance Use Topics  . Smoking status: Never Smoker  . Smokeless tobacco: Never Used  . Alcohol use No    Allergies: No Known Allergies  Meds:  Prescriptions Prior to Admission  Medication Sig Dispense Refill Last Dose  . ferrous sulfate (FERROUSUL) 325 (65 FE) MG tablet Take 1 tablet (325 mg total) by mouth daily with breakfast. 60 tablet 1 Past Month at Unknown time  . hydrocortisone-pramoxine (PROCTOFOAM HC) rectal foam Place 1 applicator rectally 2 (two) times daily. 10  g 3 Past Month at Unknown time  . nitroGLYCERIN (NITROGLYN) 2 % ointment Apply 0.5 inches topically 3 (three) times daily. Apply a pea-size amount to hemorrhoid three times a day 30 g 3   . ondansetron (ZOFRAN) 4 MG tablet Take 1 tablet (4 mg total) by mouth every 8 (eight) hours as needed for nausea or vomiting. 20 tablet 0 Past Month at Unknown time  . pantoprazole (PROTONIX) 20 MG tablet Take 1 tablet (20 mg total) by mouth daily. 30 tablet 1 Past Month at Unknown time  . Prenat-FeFum-FePo-FA-Omega 3 (CONCEPT DHA) 53.5-38-1 MG CAPS Take 1 tablet by mouth daily. 30 capsule 2 Past Week at Unknown time    I have reviewed patient's Past Medical Hx, Surgical Hx, Family Hx, Social Hx, medications and allergies.   ROS:  A comprehensive ROS was negative except per HPI.    Physical Exam  Patient Vitals for the past 24 hrs:  BP Temp Temp src Pulse Resp Height Weight  06/28/16 0129 110/69 98.6 F (37 C) Oral 84 20 5' 2.6" (1.59 m) 163 lb (73.9 kg)   Constitutional: Well-developed, well-nourished female in no acute distress.  Cardiovascular: normal rate Respiratory: normal effort GI: Abd soft, non-tender, gravid appropriate for gestational age. Pos BS x 4 MS: Extremities nontender, no edema, normal ROM Neurologic: Alert and oriented x 4.  GU:  Neg CVAT. Rectal exam: about a 2cm thrombosed hemorrhoid, tender to touch. Able to order nitroglycerine cream and placed on hemorrhoid for patient, she found improved relief to point of slightly tolerable.  SVE:  Fingertip, thick, posterior   MAU Course: Rectal exam with administration of nitroglycerine cream NST REACTIVE   I personally reviewed the patient's NST today, found to be REACTIVE. 125 bpm, mod var, +accels, no decels. CTX: Irregular, about every 3-5 min.   MDM: Plan of care reviewed with patient, including labs and tests ordered and medical treatment. Educated to patient on applying medication (use gloves to apply or wash hands  IMMEDIATELY after applying). Educated also that hemorrhoids will improve after delivery (occurring due to pregnancy). Sitz baths encouraged. Reactive NST, non-laboring cervix.    Assessment: 1. Hemorrhoids during pregnancy in third trimester   2. Back spasm     Plan: Discharge home in stable condition.  Labor precautions and fetal kick counts Nitroglycerine ointment Rx from pharmacy, tube sent home with patient after application. Sitz baths   Allergies as of 06/28/2016   No Known Allergies     Medication List    TAKE these medications   CONCEPT DHA 53.5-38-1 MG Caps Take 1 tablet by mouth daily.   ferrous sulfate 325 (65 FE) MG tablet Commonly known as:  FERROUSUL Take 1 tablet (325 mg total) by mouth daily with breakfast.   hydrocortisone-pramoxine rectal foam Commonly known as:  PROCTOFOAM HC Place 1 applicator rectally 2 (two) times daily.   nitroGLYCERIN 2 % ointment Commonly known as:  NITROGLYN Apply 0.5 inches topically 3 (three) times daily. Apply a pea-size amount to hemorrhoid three times a day   ondansetron 4 MG tablet Commonly known as:  ZOFRAN Take 1 tablet (4 mg total) by mouth every 8 (eight) hours as needed for nausea or vomiting.   pantoprazole 20 MG tablet Commonly known as:  PROTONIX Take 1 tablet (20 mg total) by mouth daily.       Jen Mow, DO [facility], [facility] 06/28/2016 2:44 AM

## 2016-06-28 NOTE — MAU Note (Signed)
Pt. Here for contractions that started around 0100 and are now getting stronger.  Also hemorrhoids are very painful. Denies sudden gush of vaginal fluid, denies vaginal bleeding and positive for fetal movement. EFM applied - FHR 140s, Toco applied - abd. Soft.

## 2016-06-28 NOTE — Discharge Instructions (Signed)
Hemorrhoids Hemorrhoids are swollen veins in and around the rectum or anus. Hemorrhoids can cause pain, itching, or bleeding. Most of the time, they do not cause serious problems. They usually get better with diet changes, lifestyle changes, and other home treatments. Follow these instructions at home: Eating and drinking   Eat foods that have fiber, such as whole grains, beans, nuts, fruits, and vegetables. Ask your doctor about taking products that have added fiber (fibersupplements).  Drink enough fluid to keep your pee (urine) clear or pale yellow. For Pain and Swelling   Take a warm-water bath (sitz bath) for 20 minutes to ease pain. Do this 3-4 times a day.  If directed, put ice on the painful area. It may be helpful to use ice between your warm baths.  Put ice in a plastic bag.  Place a towel between your skin and the bag.  Leave the ice on for 20 minutes, 2-3 times a day. General instructions   Take over-the-counter and prescription medicines only as told by your doctor.  Medicated creams and medicines that are inserted into the anus (suppositories) may be used or applied as told.  Exercise often.  Go to the bathroom when you have the urge to poop (to have a bowel movement). Do not wait.  Avoid pushing too hard (straining) when you poop.  Keep the butt area dry and clean. Use wet toilet paper or moist paper towels.  Do not sit on the toilet for a long time. Contact a doctor if:  You have any of these:  Pain and swelling that do not get better with treatment or medicine.  Bleeding that will not stop.  Trouble pooping or you cannot poop.  Pain or swelling outside the area of the hemorrhoids. This information is not intended to replace advice given to you by your health care provider. Make sure you discuss any questions you have with your health care provider. Document Released: 11/10/2007 Document Revised: 07/09/2015 Document Reviewed: 10/15/2014 Elsevier  Interactive Patient Education  2017 Elsevier Inc.  

## 2016-06-28 NOTE — MAU Note (Signed)
I have communicated with Dr. Omer JackMumaw and reviewed vital signs:  Vitals:   06/28/16 0129 06/28/16 0251  BP: 110/69 (!) 103/53  Pulse: 84 99  Resp: 20 18  Temp: 98.6 F (37 C)     Vaginal exam:  Dilation: Fingertip Exam by:: Mumaw, MD,   Also reviewed contraction pattern and that non-stress test is reactive.  It has been documented that patient is contracting irregularly  with no cervical  Dilatation.  Patient also here for painful hemorrhoids.  Based on this report provider has given order for discharge.  A discharge order and diagnosis entered by a provider.   Labor discharge instructions reviewed with patient.

## 2016-06-29 ENCOUNTER — Other Ambulatory Visit: Payer: Self-pay | Admitting: Advanced Practice Midwife

## 2016-07-01 ENCOUNTER — Telehealth: Payer: Self-pay | Admitting: *Deleted

## 2016-07-01 ENCOUNTER — Inpatient Hospital Stay (HOSPITAL_COMMUNITY)
Admission: RE | Admit: 2016-07-01 | Discharge: 2016-07-05 | DRG: 774 | Disposition: A | Discharge status: Home / Self Care | Payer: Medicaid Other | Source: Ambulatory Visit | Attending: Obstetrics and Gynecology | Admitting: Obstetrics and Gynecology

## 2016-07-01 ENCOUNTER — Encounter (HOSPITAL_COMMUNITY): Payer: Self-pay

## 2016-07-01 DIAGNOSIS — Z3400 Encounter for supervision of normal first pregnancy, unspecified trimester: Secondary | ICD-10-CM

## 2016-07-01 DIAGNOSIS — M62830 Muscle spasm of back: Secondary | ICD-10-CM

## 2016-07-01 DIAGNOSIS — O480 Post-term pregnancy: Secondary | ICD-10-CM | POA: Diagnosis present

## 2016-07-01 DIAGNOSIS — O2243 Hemorrhoids in pregnancy, third trimester: Secondary | ICD-10-CM | POA: Diagnosis present

## 2016-07-01 DIAGNOSIS — Z3A41 41 weeks gestation of pregnancy: Secondary | ICD-10-CM

## 2016-07-01 LAB — CBC
HCT: 31.7 % — ABNORMAL LOW (ref 36.0–46.0)
Hemoglobin: 10.8 g/dL — ABNORMAL LOW (ref 12.0–15.0)
MCH: 31.1 pg (ref 26.0–34.0)
MCHC: 34.1 g/dL (ref 30.0–36.0)
MCV: 91.4 fL (ref 78.0–100.0)
Platelets: 174 10*3/uL (ref 150–400)
RBC: 3.47 MIL/uL — ABNORMAL LOW (ref 3.87–5.11)
RDW: 14.6 % (ref 11.5–15.5)
WBC: 9.4 10*3/uL (ref 4.0–10.5)

## 2016-07-01 MED ORDER — ACETAMINOPHEN 325 MG PO TABS
650.0000 mg | ORAL_TABLET | ORAL | Status: DC | PRN
  Administered 2016-07-03: 650 mg via ORAL
  Filled 2016-07-01: qty 2

## 2016-07-01 MED ORDER — TERBUTALINE SULFATE 1 MG/ML IJ SOLN: 0.2500 mg | Freq: Once | INTRAMUSCULAR | Status: DC | PRN

## 2016-07-01 MED ORDER — ZOLPIDEM TARTRATE 5 MG PO TABS: 5.0000 mg | ORAL_TABLET | Freq: Every evening | ORAL | Status: DC | PRN

## 2016-07-01 MED ORDER — ONDANSETRON HCL 4 MG/2ML IJ SOLN
4.0000 mg | Freq: Four times a day (QID) | INTRAMUSCULAR | Status: DC | PRN
  Administered 2016-07-02: 4 mg via INTRAVENOUS
  Filled 2016-07-01: qty 2

## 2016-07-01 MED ORDER — OXYTOCIN 40 UNITS IN LACTATED RINGERS INFUSION - SIMPLE MED
2.5000 [IU]/h | INTRAVENOUS | Status: DC
  Administered 2016-07-03: 2.5 [IU]/h via INTRAVENOUS

## 2016-07-01 MED ORDER — OXYCODONE-ACETAMINOPHEN 5-325 MG PO TABS: 2.0000 | ORAL_TABLET | ORAL | Status: DC | PRN

## 2016-07-01 MED ORDER — SOD CITRATE-CITRIC ACID 500-334 MG/5ML PO SOLN
30.0000 mL | ORAL | Status: DC | PRN
  Administered 2016-07-03: 30 mL via ORAL
  Filled 2016-07-01: qty 15

## 2016-07-01 MED ORDER — MISOPROSTOL 25 MCG QUARTER TABLET
25.0000 ug | ORAL_TABLET | ORAL | Status: DC | PRN
  Administered 2016-07-01 – 2016-07-02 (×2): 25 ug via VAGINAL
  Filled 2016-07-01 (×2): qty 1

## 2016-07-01 MED ORDER — LACTATED RINGERS IV SOLN: 500.0000 mL | INTRAVENOUS | Status: DC | PRN

## 2016-07-01 MED ORDER — LIDOCAINE HCL (PF) 1 % IJ SOLN
30.0000 mL | INTRAMUSCULAR | Status: DC | PRN
  Filled 2016-07-01: qty 30

## 2016-07-01 MED ORDER — OXYTOCIN BOLUS FROM INFUSION
500.0000 mL | Freq: Once | INTRAVENOUS | Status: AC
  Administered 2016-07-03: 500 mL via INTRAVENOUS

## 2016-07-01 MED ORDER — OXYCODONE-ACETAMINOPHEN 5-325 MG PO TABS: 1.0000 | ORAL_TABLET | ORAL | Status: DC | PRN

## 2016-07-01 MED ORDER — LACTATED RINGERS IV SOLN
INTRAVENOUS | Status: DC
  Administered 2016-07-01 – 2016-07-02 (×4): via INTRAVENOUS

## 2016-07-01 MED ORDER — FENTANYL CITRATE (PF) 100 MCG/2ML IJ SOLN
100.0000 ug | INTRAMUSCULAR | Status: DC | PRN
  Administered 2016-07-02 – 2016-07-03 (×4): 100 ug via INTRAVENOUS
  Filled 2016-07-01 (×4): qty 2

## 2016-07-01 NOTE — Telephone Encounter (Signed)
Received message left on the nurse voicemail from [facility] at [facility].  States she sent over something for a pre-auth for nitrobid ointment and hasn't heard anything.  Requests a return call to [phone].

## 2016-07-02 ENCOUNTER — Inpatient Hospital Stay (HOSPITAL_COMMUNITY): Payer: Medicaid Other | Admitting: Anesthesiology

## 2016-07-02 ENCOUNTER — Encounter (HOSPITAL_COMMUNITY): Payer: Self-pay

## 2016-07-02 LAB — ABO/RH: ABO/RH(D): O POS

## 2016-07-02 LAB — RPR: RPR: NONREACTIVE

## 2016-07-02 MED ORDER — LACTATED RINGERS IV SOLN
500.0000 mL | Freq: Once | INTRAVENOUS | Status: AC
  Administered 2016-07-02: 500 mL via INTRAVENOUS

## 2016-07-02 MED ORDER — TERBUTALINE SULFATE 1 MG/ML IJ SOLN: 0.2500 mg | Freq: Once | INTRAMUSCULAR | Status: DC | PRN

## 2016-07-02 MED ORDER — OXYTOCIN 40 UNITS IN LACTATED RINGERS INFUSION - SIMPLE MED
1.0000 m[IU]/min | INTRAVENOUS | Status: DC
  Administered 2016-07-02: 2 m[IU]/min via INTRAVENOUS
  Filled 2016-07-02: qty 1000

## 2016-07-02 MED ORDER — PHENYLEPHRINE 40 MCG/ML (10ML) SYRINGE FOR IV PUSH (FOR BLOOD PRESSURE SUPPORT)
80.0000 ug | PREFILLED_SYRINGE | INTRAVENOUS | Status: DC | PRN
  Filled 2016-07-02: qty 10

## 2016-07-02 MED ORDER — LIDOCAINE HCL (PF) 1 % IJ SOLN
INTRAMUSCULAR | Status: DC | PRN
  Administered 2016-07-02 (×2): 5 mL

## 2016-07-02 MED ORDER — FENTANYL 2.5 MCG/ML BUPIVACAINE 1/10 % EPIDURAL INFUSION (WH - ANES)
14.0000 mL/h | INTRAMUSCULAR | Status: DC | PRN
  Administered 2016-07-02 (×3): 14 mL/h via EPIDURAL
  Filled 2016-07-02 (×3): qty 100

## 2016-07-02 MED ORDER — EPHEDRINE 5 MG/ML INJ: 10.0000 mg | INTRAVENOUS | Status: DC | PRN

## 2016-07-02 MED ORDER — DIPHENHYDRAMINE HCL 50 MG/ML IJ SOLN: 12.5000 mg | INTRAMUSCULAR | Status: DC | PRN

## 2016-07-02 MED ORDER — SCOPOLAMINE 1 MG/3DAYS TD PT72
1.0000 | MEDICATED_PATCH | TRANSDERMAL | Status: DC
  Filled 2016-07-02: qty 1

## 2016-07-02 MED ORDER — PROMETHAZINE HCL 25 MG/ML IJ SOLN
12.5000 mg | Freq: Four times a day (QID) | INTRAMUSCULAR | Status: DC | PRN
  Administered 2016-07-02: 12.5 mg via INTRAVENOUS
  Filled 2016-07-02: qty 1

## 2016-07-02 NOTE — H&P (Signed)
Carrie Durham is a 36 y.o. female G2P0010 @ 41.1wks presenting for IOL for postdates. She denies any contractions or leaking of fluid. Reports some back pain and good fetal movement.    OB History    Gravida Para Term Preterm AB Living   2 0 0 0 1 0   SAB TAB Ectopic Multiple Live Births   1 0 0 0 0     Past Medical History:  Diagnosis Date  . Hemorrhoids during pregnancy in third trimester 06/10/2016  . Medical history non-contributory    Past Surgical History:  Procedure Laterality Date  . NO PAST SURGERIES     Family History: family history is not on file. Social History:  reports that she has never smoked. She has never used smokeless tobacco. She reports that she does not drink alcohol or use drugs.     Maternal Diabetes: No Genetic Screening: Normal Maternal Ultrasounds/Referrals: Normal Fetal Ultrasounds or other Referrals:  None Maternal Substance Abuse:  No Significant Maternal Medications:  None Significant Maternal Lab Results:  Lab values include: Group B Strep negative Other Comments:  None  ROS Maternal Medical History:  Reason for admission: IOL for postdates  Contractions: Frequency: rare.   Perceived severity is mild.    Fetal activity: Perceived fetal activity is normal.   Last perceived fetal movement was within the past hour.    Prenatal complications: no prenatal complications Prenatal Complications - Diabetes: none.    Dilation: Closed Effacement (%): 60 Station: -3 Exam by:: e. poore, rnc Blood pressure 116/73, pulse 86, temperature 97.8 F (36.6 C), temperature source Oral, resp. rate 18, height 5' 0.24" (1.53 m), weight 163 lb (73.9 kg), last menstrual period 09/18/2015. Maternal Exam:  Uterine Assessment: Contraction strength is mild.  Contraction frequency is rare.   Abdomen: Patient reports no abdominal tenderness. Fetal presentation: vertex  Introitus: Ferning test: not done.  Nitrazine test: not done. Amniotic fluid  character: not assessed.  Pelvis: adequate for delivery.   Cervix: Cervix evaluated by digital exam.     Fetal Exam Fetal Monitor Review: Mode: ultrasound.   Baseline rate: 140.  Variability: moderate (6-25 bpm).   Pattern: accelerations present and no decelerations.    Fetal State Assessment: Category I - tracings are normal.     Physical Exam  Nursing note and vitals reviewed. Constitutional: She appears well-developed and well-nourished.  HENT:  Head: Normocephalic and atraumatic.  Eyes: Conjunctivae are normal. No scleral icterus.  Respiratory: Effort normal. No respiratory distress.  GI: Soft. There is no tenderness.  Neurological: She is alert.  Skin: Skin is warm and dry.  Psychiatric: She has a normal mood and affect. Her behavior is normal. Judgment and thought content normal.    Prenatal labs: ABO, Rh: --/--/O POS (05/18 2259) Antibody: NEG (05/18 2259) Rubella: 21.40 (11/17 1021) RPR: NON REAC (02/16 1013)  HBsAg: NEGATIVE (11/17 1021)  HIV: NONREACTIVE (02/16 1016)  GBS: Negative (04/13 0000)   Assessment/Plan: IUP at term. IOL for post dates. GBS neg.  Cytotec for cervical ripening. Progress to foley when able and plan pitocin after.    Cleone SlimCaroline Neill SNM 07/02/2016, 1:10 AM  CNM attestation:  I have seen and examined this patient; I agree with above documentation in the student nurse midwife's note.   Carrie Durham is a 36 y.o. G2P0010 here for IOL for postdates  PE: BP 116/73   Pulse 86   Temp 97.8 F (36.6 C) (Oral)   Resp 18   Ht 5' 0.24" (  1.53 m)   Wt 73.9 kg (163 lb)   LMP 09/18/2015 (Exact Date)   BMI 31.58 kg/m  Gen: calm comfortable, NAD Resp: normal effort, no distress Abd: gravid  ROS, labs, PMH reviewed  Plan: Admit to [facility] cytotec for cx ripening, then progress to foley/Pit Anticipate SVD  SHAW, KIMBERLY CNM 07/02/2016, 4:00 AM

## 2016-07-02 NOTE — Progress Notes (Signed)
Patient seen. Doing well. Comfortable after epidural. Foley bulb placed. Cervix 1.5/90/-2.   FHTS 140/mod var/reactive  Expect SVD.  Ernestina PennaNicholas Real Cona, MD 07/02/16 10:19 AM

## 2016-07-02 NOTE — Anesthesia Preprocedure Evaluation (Signed)

## 2016-07-02 NOTE — Progress Notes (Signed)
Patient ID: Carrie BatheNouara Durham, female   DOB: 10/12/1980, 36 y.o.   MRN: [id number]  Feeling ctx more; had Fentanyl almost 2 hrs ago; cytotec x 2 VSS, afeb FHR 130s, +accels, no decels, Cat 1 Ctx q 2-4 mins Cx deferred (was FT)  IUP@term  Cx unfavorable  Plan to reeval cx @ 8 (4hrs after last cyto) to determine plan Anticipate SVD  Cam HaiSHAW, KIMBERLY CNM 07/02/2016 7:21 AM

## 2016-07-02 NOTE — Anesthesia Pain Management Evaluation Note (Signed)
  CRNA Pain Management Visit Note  Patient: Carrie BatheNouara Oriordan, 36 y.o., female  "Hello I am a [facility] of the anesthesia team at [facility]. We have an anesthesia team available at all times to provide care throughout the hospital, including epidural management and anesthesia for C-section. I don't know your plan for the delivery whether it a natural birth, water birth, IV sedation, nitrous supplementation, doula or epidural, but we want to meet your pain goals."   1.Was your pain managed to your expectations on prior hospitalizations?   No prior hospitalizations  2.What is your expectation for pain management during this hospitalization?     Epidural  3.How can we help you reach that goal?   Record the patient's initial score and the patient's pain goal.   Pain: 0  Pain Goal: 2 [facility] wants you to be able to say your pain was always managed very well.  Laban EmperorMalinova,Rosario Duey Hristova 07/02/2016

## 2016-07-02 NOTE — Progress Notes (Signed)
Patient ID: Carrie Durham, female   DOB: 09/30/1980, 36 y.o.   MRN: [id number] Carrie Batheouara Haverstock is a 36 y.o. G2P0010 at 3277w1d.  Subjective: Feeling pressure w/ UC's  Objective: BP (!) 99/51   Pulse 89   Temp 97.8 F (36.6 C) (Oral)   Resp 18   Ht 5' 0.24" (1.53 m)   Wt 163 lb (73.9 kg)   LMP 09/18/2015 (Exact Date)   BMI 31.58 kg/m    FHT:  FHR: 150 bpm, variability: mod,  accelerations:  10x10,  decelerations:  lates, earies and variables, possibly 2/2 tachysystoly UC:   Q 1-3 minutes, strong, some inadequate relaxation. Dilation: Lip/rim, reducible Effacement (%): 80, 90 Cervical Position: Posterior Station: +2 Presentation: Vertex Exam by:: Dorathy KinsmanVirginia Meridian Scherger, CNM  Bleeding slightly heavier than bloody show.   Labs: No results found for this or any previous visit (from the past 24 hour(s)).  Assessment / Plan: 3377w1d week IUP Labor: Transition Fetal Wellbeing:  Category II Pain Control:  Epidural Anticipated MOD:  SVD Pitocin discontinued. FHR improved quickly. Will try pushing in 2- 30 minutes.  Dr. Alysia PennaErvin updated.   Katrinka BlazingSmith, [place], CNM 07/02/2016 11:58 PM

## 2016-07-02 NOTE — Progress Notes (Signed)
Carrie Durham is a 36 y.o. G2P0010 at 2921w1d  admitted for induction of labor due to Post dates.   Subjective: Patient seen and evaluated for labor progress. Patient reports some back pain, and abdominal pain  Objective: Vitals:   07/02/16 2100 07/02/16 2130 07/02/16 2200 07/02/16 2230  BP: (!) 103/54 (!) 100/51 99/61 (!) 99/51  Pulse: 90 91 83 89  Resp: 18 18 18 18   Temp: 97.8 F (36.6 C)     TempSrc: Oral     Weight:      Height:       FHT:  FHR: 145 bpm, variability: moderate,  accelerations:  Present,  decelerations:  Present early UC:   regular, every 2-3 minutes SVE:   Dilation: 6  Effacement: 80-90%  Station: 0  Exam by: Carrie Durham SNM Pitocin @ 12 mu/min  Abdomen soft, tender with palpation   Assessment / Plan: IUP at 41.1 weeks. IOL for PD. GBS neg. Unchanged cervix. Vaginal bleeding.  Vaginal bleeding heavier than bloody show but not concerning for abruption at this time. FHR reassuring and abdomen soft. No cervical change, swelling noted on patient's right side. Continue to monitor closely. Dr. Alysia PennaErvin updated.  Plan: Recheck cervix in 30 minutes to reassess swelling and vaginal bleeding.  Carrie Durham SNM 07/02/2016, 10:57 PM

## 2016-07-02 NOTE — Progress Notes (Signed)
Patient seen doing well. No complaints. Comfortable with epidural. Minimal change since foley out. Cervix 6/90/-1. IUPC placed and AROM forebag for clear fluid.  Category 1 FHTS.  Carrie PennaNicholas Raelea Gosse, MD 07/02/16 3:51 PM

## 2016-07-02 NOTE — Anesthesia Procedure Notes (Signed)
Epidural Patient location during procedure: OB  Staffing Anesthesiologist: Pennelope Basque Performed: anesthesiologist   Preanesthetic Checklist Completed: patient identified, site marked, surgical consent, pre-op evaluation, timeout performed, IV checked, risks and benefits discussed and monitors and equipment checked  Epidural Patient position: sitting Prep: DuraPrep Patient monitoring: heart rate, continuous pulse ox and blood pressure Approach: midline Location: L3-L4 Injection technique: LOR saline  Needle:  Needle type: Tuohy  Needle gauge: 17 G Needle length: 9 cm and 9 Needle insertion depth: 5 cm Catheter type: closed end flexible Catheter size: 20 Guage Catheter at skin depth: 9 cm Test dose: negative  Assessment Events: blood not aspirated, injection not painful, no injection resistance, negative IV test and no paresthesia  Additional Notes Patient identified. Risks/Benefits/Options discussed with patient including but not limited to bleeding, infection, nerve damage, paralysis, failed block, incomplete pain control, headache, blood pressure changes, nausea, vomiting, reactions to medication both or allergic, itching and postpartum back pain. Confirmed with bedside nurse the patient's most recent platelet count. Confirmed with patient that they are not currently taking any anticoagulation, have any bleeding history or any family history of bleeding disorders. Patient expressed understanding and wished to proceed. All questions were answered. Sterile technique was used throughout the entire procedure. Please see nursing notes for vital signs. Test dose was given through epidural needle and negative prior to continuing to dose epidural or start infusion. Warning signs of high block given to the patient including shortness of breath, tingling/numbness in hands, complete motor block, or any concerning symptoms with instructions to call for help. Patient was given instructions  on fall risk and not to get out of bed. All questions and concerns addressed with instructions to call with any issues.     

## 2016-07-03 ENCOUNTER — Encounter (HOSPITAL_COMMUNITY): Payer: Self-pay

## 2016-07-03 ENCOUNTER — Encounter (HOSPITAL_COMMUNITY)
Admission: RE | Disposition: A | Discharge status: Home / Self Care | Payer: Self-pay | Source: Ambulatory Visit | Attending: Obstetrics and Gynecology

## 2016-07-03 DIAGNOSIS — O480 Post-term pregnancy: Secondary | ICD-10-CM

## 2016-07-03 DIAGNOSIS — Z3A41 41 weeks gestation of pregnancy: Secondary | ICD-10-CM

## 2016-07-03 HISTORY — PX: REPAIR VAGINAL CUFF: SHX6067

## 2016-07-03 LAB — HEMOGLOBIN AND HEMATOCRIT, BLOOD
HCT: 27.9 % — ABNORMAL LOW (ref 36.0–46.0)
Hemoglobin: 9.6 g/dL — ABNORMAL LOW (ref 12.0–15.0)

## 2016-07-03 SURGERY — REPAIR, VAGINAL CUFF
Anesthesia: Epidural | Site: Cervix

## 2016-07-03 MED ORDER — DEXAMETHASONE SODIUM PHOSPHATE 4 MG/ML IJ SOLN
INTRAMUSCULAR | Status: DC | PRN
  Administered 2016-07-03: 4 mg via INTRAVENOUS

## 2016-07-03 MED ORDER — SODIUM BICARBONATE 8.4 % IV SOLN
INTRAVENOUS | Status: AC
  Filled 2016-07-03: qty 50

## 2016-07-03 MED ORDER — SENNOSIDES-DOCUSATE SODIUM 8.6-50 MG PO TABS
2.0000 | ORAL_TABLET | ORAL | Status: DC
  Administered 2016-07-03 – 2016-07-04 (×2): 2 via ORAL
  Filled 2016-07-03 (×2): qty 2

## 2016-07-03 MED ORDER — GENTAMICIN SULFATE 40 MG/ML IJ SOLN
130.0000 mg | Freq: Three times a day (TID) | INTRAVENOUS | Status: DC
  Filled 2016-07-03: qty 3.25

## 2016-07-03 MED ORDER — ONDANSETRON HCL 4 MG PO TABS: 4.0000 mg | ORAL_TABLET | ORAL | Status: DC | PRN

## 2016-07-03 MED ORDER — FENTANYL CITRATE (PF) 100 MCG/2ML IJ SOLN
INTRAMUSCULAR | Status: DC | PRN
  Administered 2016-07-03 (×2): 25 ug via INTRAVENOUS

## 2016-07-03 MED ORDER — FENTANYL CITRATE (PF) 100 MCG/2ML IJ SOLN: 25.0000 ug | INTRAMUSCULAR | Status: DC | PRN

## 2016-07-03 MED ORDER — CEFAZOLIN SODIUM-DEXTROSE 2-3 GM-% IV SOLR
INTRAVENOUS | Status: DC | PRN
  Administered 2016-07-03: 2 g via INTRAVENOUS

## 2016-07-03 MED ORDER — SODIUM CHLORIDE 0.9 % IR SOLN
Status: DC | PRN
  Administered 2016-07-03: 400 mL

## 2016-07-03 MED ORDER — LACTATED RINGERS IV SOLN: INTRAVENOUS | Status: DC | PRN

## 2016-07-03 MED ORDER — ONDANSETRON HCL 4 MG/2ML IJ SOLN
INTRAMUSCULAR | Status: AC
  Filled 2016-07-03: qty 2

## 2016-07-03 MED ORDER — DEXAMETHASONE SODIUM PHOSPHATE 4 MG/ML IJ SOLN
INTRAMUSCULAR | Status: AC
  Filled 2016-07-03: qty 1

## 2016-07-03 MED ORDER — GENTAMICIN SULFATE 40 MG/ML IJ SOLN
150.0000 mg | Freq: Once | INTRAVENOUS | Status: AC
  Administered 2016-07-03: 150 mg via INTRAVENOUS
  Filled 2016-07-03: qty 3.75

## 2016-07-03 MED ORDER — PROPOFOL 10 MG/ML IV BOLUS
INTRAVENOUS | Status: DC | PRN
Start: 2016-07-03 — End: 2016-07-03
  Administered 2016-07-03 (×3): 10 mg via INTRAVENOUS

## 2016-07-03 MED ORDER — SODIUM BICARBONATE 8.4 % IV SOLN
INTRAVENOUS | Status: DC | PRN
  Administered 2016-07-03: 3 mL via EPIDURAL
  Administered 2016-07-03: 2 mL via EPIDURAL
  Administered 2016-07-03 (×2): 5 mL via EPIDURAL

## 2016-07-03 MED ORDER — SODIUM CHLORIDE 0.9 % IV SOLN
2.0000 g | Freq: Once | INTRAVENOUS | Status: AC
  Administered 2016-07-03: 2 g via INTRAVENOUS
  Filled 2016-07-03: qty 2000

## 2016-07-03 MED ORDER — PRENATAL MULTIVITAMIN CH
1.0000 | ORAL_TABLET | Freq: Every day | ORAL | Status: DC
  Administered 2016-07-03 – 2016-07-05 (×3): 1 via ORAL
  Filled 2016-07-03 (×3): qty 1

## 2016-07-03 MED ORDER — ACETAMINOPHEN 325 MG PO TABS
650.0000 mg | ORAL_TABLET | ORAL | Status: DC | PRN
  Administered 2016-07-04: 650 mg via ORAL
  Filled 2016-07-03: qty 2

## 2016-07-03 MED ORDER — ONDANSETRON HCL 4 MG/2ML IJ SOLN
INTRAMUSCULAR | Status: DC | PRN
  Administered 2016-07-03: 4 mg via INTRAVENOUS

## 2016-07-03 MED ORDER — MEPERIDINE HCL 25 MG/ML IJ SOLN
INTRAMUSCULAR | Status: AC
  Filled 2016-07-03: qty 1

## 2016-07-03 MED ORDER — BENZOCAINE-MENTHOL 20-0.5 % EX AERO
1.0000 "application " | INHALATION_SPRAY | CUTANEOUS | Status: DC | PRN
  Administered 2016-07-03: 1 via TOPICAL
  Filled 2016-07-03: qty 56

## 2016-07-03 MED ORDER — MIDAZOLAM HCL 2 MG/2ML IJ SOLN
INTRAMUSCULAR | Status: AC
  Filled 2016-07-03: qty 2

## 2016-07-03 MED ORDER — MEPERIDINE HCL 25 MG/ML IJ SOLN: 6.2500 mg | INTRAMUSCULAR | Status: DC | PRN

## 2016-07-03 MED ORDER — COCONUT OIL OIL
1.0000 "application " | TOPICAL_OIL | Status: DC | PRN
  Administered 2016-07-04: 1 via TOPICAL
  Filled 2016-07-03: qty 120

## 2016-07-03 MED ORDER — DIBUCAINE 1 % RE OINT
1.0000 "application " | TOPICAL_OINTMENT | RECTAL | Status: DC | PRN
  Administered 2016-07-04: 1 via RECTAL
  Filled 2016-07-03: qty 28

## 2016-07-03 MED ORDER — METOCLOPRAMIDE HCL 5 MG/ML IJ SOLN
10.0000 mg | Freq: Once | INTRAMUSCULAR | Status: DC | PRN
Start: 2016-07-03 — End: 2016-07-03

## 2016-07-03 MED ORDER — MEPERIDINE HCL 25 MG/ML IJ SOLN
INTRAMUSCULAR | Status: DC | PRN
  Administered 2016-07-03: 25 mg via INTRAVENOUS

## 2016-07-03 MED ORDER — DIPHENHYDRAMINE HCL 25 MG PO CAPS: 25.0000 mg | ORAL_CAPSULE | Freq: Four times a day (QID) | ORAL | Status: DC | PRN

## 2016-07-03 MED ORDER — IBUPROFEN 600 MG PO TABS
600.0000 mg | ORAL_TABLET | Freq: Four times a day (QID) | ORAL | Status: DC
  Administered 2016-07-03 – 2016-07-05 (×10): 600 mg via ORAL
  Filled 2016-07-03 (×10): qty 1

## 2016-07-03 MED ORDER — MIDAZOLAM HCL 2 MG/2ML IJ SOLN
INTRAMUSCULAR | Status: DC | PRN
  Administered 2016-07-03: 2 mg via INTRAVENOUS

## 2016-07-03 MED ORDER — LIDOCAINE HCL (CARDIAC) 20 MG/ML IV SOLN
INTRAVENOUS | Status: DC | PRN
  Administered 2016-07-03 (×2): 10 mL via INTRAVENOUS

## 2016-07-03 MED ORDER — LACTATED RINGERS IV SOLN: INTRAVENOUS | Status: DC

## 2016-07-03 MED ORDER — SIMETHICONE 80 MG PO CHEW: 80.0000 mg | CHEWABLE_TABLET | ORAL | Status: DC | PRN

## 2016-07-03 MED ORDER — SODIUM CHLORIDE 0.9 % IJ SOLN
INTRAMUSCULAR | Status: AC
  Filled 2016-07-03: qty 10

## 2016-07-03 MED ORDER — LIDOCAINE-EPINEPHRINE (PF) 2 %-1:200000 IJ SOLN
INTRAMUSCULAR | Status: AC
  Filled 2016-07-03: qty 20

## 2016-07-03 MED ORDER — TETANUS-DIPHTH-ACELL PERTUSSIS 5-2.5-18.5 LF-MCG/0.5 IM SUSP: 0.5000 mL | Freq: Once | INTRAMUSCULAR | Status: DC

## 2016-07-03 MED ORDER — WITCH HAZEL-GLYCERIN EX PADS
1.0000 "application " | MEDICATED_PAD | CUTANEOUS | Status: DC | PRN
  Administered 2016-07-04: 1 via TOPICAL

## 2016-07-03 MED ORDER — ZOLPIDEM TARTRATE 5 MG PO TABS: 5.0000 mg | ORAL_TABLET | Freq: Every evening | ORAL | Status: DC | PRN

## 2016-07-03 MED ORDER — SCOPOLAMINE 1 MG/3DAYS TD PT72
MEDICATED_PATCH | TRANSDERMAL | Status: DC | PRN
  Administered 2016-07-03: 1 via TRANSDERMAL

## 2016-07-03 MED ORDER — METOCLOPRAMIDE HCL 5 MG/ML IJ SOLN: 10.0000 mg | Freq: Once | INTRAMUSCULAR | Status: DC | PRN

## 2016-07-03 MED ORDER — ONDANSETRON HCL 4 MG/2ML IJ SOLN: 4.0000 mg | INTRAMUSCULAR | Status: DC | PRN

## 2016-07-03 MED ORDER — LACTATED RINGERS IV SOLN
INTRAVENOUS | Status: DC
  Administered 2016-07-03: 04:00:00 via INTRAVENOUS

## 2016-07-03 MED ORDER — SCOPOLAMINE 1 MG/3DAYS TD PT72
MEDICATED_PATCH | TRANSDERMAL | Status: AC
  Filled 2016-07-03: qty 1

## 2016-07-03 MED ORDER — FENTANYL CITRATE (PF) 100 MCG/2ML IJ SOLN
INTRAMUSCULAR | Status: AC
  Filled 2016-07-03: qty 2

## 2016-07-03 NOTE — Progress Notes (Signed)
Patient's foley removed around 1230 and voided 50ml at 1730. Voided 200mL at 2100. Bladder scan showed 109mL of urine. Patient does not feel that she has to void. Uterus firm, midline and fundus is UE. Told patient to drink more fluid and try to void again in one hour. Will continue to monitor.

## 2016-07-03 NOTE — Anesthesia Postprocedure Evaluation (Signed)
Anesthesia Post Note  Patient: Carrie Durham  Procedure(s) Performed: Procedure(s) (LRB): repair of cervical laceration (N/A)  Patient location during evaluation: Mother Baby Anesthesia Type: Epidural Level of consciousness: awake Pain management: pain level controlled Vital Signs Assessment: post-procedure vital signs reviewed and stable Respiratory status: spontaneous breathing Postop Assessment: no headache, no backache, epidural receding, patient able to bend at knees, no signs of nausea or vomiting and adequate PO intake Anesthetic complications: no        Last Vitals:  Vitals:   07/03/16 0815 07/03/16 1228  BP: (!) 110/56 (!) 93/48  Pulse: 91 75  Resp: 20 20  Temp: 36.8 C 36.5 C    Last Pain:  Vitals:   07/03/16 1230  TempSrc:   PainSc: 2    Pain Goal: Patients Stated Pain Goal: 8 (07/01/16 2301)               Fanny DanceMULLINS,Shallen Luedke

## 2016-07-03 NOTE — Anesthesia Procedure Notes (Signed)
Procedure Name: MAC Performed by: Adel Burch M Pre-anesthesia Checklist: Patient identified, Emergency Drugs available, Suction available, Patient being monitored and Timeout performed Oxygen Delivery Method: Nasal cannula       

## 2016-07-03 NOTE — Transfer of Care (Signed)
Immediate Anesthesia Transfer of Care Note  Patient: Carrie Durham  Procedure(s) Performed: Procedure(s): repair of cervical laceration (N/A)  Patient Location: PACU  Anesthesia Type:Epidural  Level of Consciousness: awake, alert  and oriented  Airway & Oxygen Therapy: Patient Spontanous Breathing  Post-op Assessment: Report given to RN and Post -op Vital signs reviewed and stable  Post vital signs: Reviewed and stable  Last Vitals:  Vitals:   07/03/16 0231 07/03/16 0246  BP: 115/61 104/73  Pulse: 84 91  Resp: 18 18  Temp:      Last Pain:  Vitals:   07/03/16 0203  TempSrc: Oral  PainSc:       Patients Stated Pain Goal: 8 (07/01/16 2301)  Complications: No apparent anesthesia complications

## 2016-07-03 NOTE — Consult Note (Signed)
[facility]  Delivery Note:  SVD    07/03/2016  1:50 AM  I was called to the delivery room at the request of the patient's obstetrician (Dr. Ervin) for Vacuum assistance.  PRENATAL HX:  This is a 36 y/o G2P0010 at 41 and 2/[redacted] weeks gestation who was admitted on 5/19 for induction for post-dates.  She is GBS negative and her pregnancy has been uncomplicated.  She did develop a fever during labor to 101.4 with ROM x 10 hours. She received Ampicillin and Gentamicin ~ 1 hour prior to delivery.    DELIVERY:  Infant delivered by vacuum extraction.  He had poor tone and and apnea at delivery and did not respond to standard warming, drying and stimulation.  HR was ~ 60 bpm so PPV was given for 30 seconds with immediate improvement in HR.  HR them remained >100 and a pulse oximeter showed oxygen saturations were in the 90s by 5 minutes.  Tone gradually improved.  APGARs 1, 8 and 9.  Exam notable for caput and molding, but was otherwise within normal limits.  After 10 minutes, baby left with nurse to assist parents with skin-to-skin care.  He does have an increased risk for early onset sepsis given maternal fever, but per the [employer] sepsis calculator, the risk in a well appearing infant is only 0.83/1000.  Antibiotics and blood culture are not recommended, but the infant should have vital signs checked every 4 hours.  If his clinical status changes, please contact the neonatologist, as he will likely need a more complete sepsis evaluation.    _____________________ Electronically Signed By: Karole Oo, MD Neonatologist  

## 2016-07-03 NOTE — Progress Notes (Signed)
Pharmacy Antibiotic Note  Jake Batheouara Pauli is a 36 y.o. female G2P0010 at 4142w2d admitted on 07/01/2016 for IOL. Pharmacy has been consulted for Gentamicin dosing for chorioamnionitis.  Plan: Gentamicin 150 mg IV loading dose Gentamicin 130 mg IV every 8 hours Monitor Scr per protocol Serum Gentamicin levels as indicated   Height: 5' 0.24" (153 cm) Weight: 163 lb (73.9 kg) IBW/kg (Calculated) : 46.04  Temp (24hrs), Avg:98.5 F (36.9 C), Min:97.4 F (36.3 C), Max:99.6 F (37.6 C)   Recent Labs Lab 07/01/16 2259  WBC 9.4    CrCl cannot be calculated (Patient's most recent lab result is older than the maximum 21 days allowed.).    No Known Allergies  Antimicrobials this admission: Ampicillin 2 Gm IV 07/03/17 >>   Thank you for allowing pharmacy to be a part of this patient's care.  Arelia SneddonMason, Elba Schaber Anne 07/03/2016 1:15 AM

## 2016-07-03 NOTE — Progress Notes (Signed)
Patient's B/P 89/47, asymptomatic, ambulates in room. Notified Marlynn Perking. Lawson, CNM. No new orders. Will continue to monitor.

## 2016-07-03 NOTE — Op Note (Signed)
[name] PROCEDURE DATE: 07/01/2016 - 07/03/2016  PREOPERATIVE DIAGNOSES: DOD VAVD with cervical laceration POSTOPERATIVE DIAGNOSES: The same PROCEDURE: Repair of cervical laceration SURGEON:  Dr Nettie ElmMichael Cindia Hustead    INDICATIONS: 36 y.o. G2P1011 here for repair of cervical laceration following a VAVD.   Risks of surgery were discussed with the patient including but not limited to: bleeding which may require transfusion or reoperation; infection which may require antibiotics; injury to bowel, bladder, ureters or other surrounding organs; need for additional procedures; thromboembolic phenomenon, incisional problems and other postoperative/anesthesia complications. Written informed consent was obtained.    FINDINGS:  Extensive cervical laceration involving multiple planes and locations, extending to at least 6-7 cm past the cervical os.   ANESTHESIA:    Epidural INTRAVENOUS FLUIDS:1000  ml ESTIMATED BLOOD LOSS:125 ml URINE OUTPUT: As recorded SPECIMENS: none COMPLICATIONS: None immediate  PROCEDURE IN DETAIL:  The patient had received intravenous antibiotics due to Triple I. However she did received a dose of Ancef. SCD's were already in placed and remained.  She was then taken to the operating room where her Epidural was dose to adequate level. She was placed in the dorsal lithotomy/supine position, and was prepped and draped in a sterile manner.  A Foley catheter was inserted into her bladder and attached to constant drainage. The urine was noted to slightly blood tinged.  A timeout was performed.  Weighted speculum and deaver's were placed in the vagina exposing the cervix. The edges of the cervix were grasped with ring forceps. The laceration was noted to have the above findings. There was no evidence of exstention into the bladder or abdominal cavity. The laceration edges were aligned to anatomical correctness and reapproximated with interrupted 2/0 Vicryl. The repair was marched up the  laceration until the apex of the laceration was noted. This allowed completed repair of the laceration.   Inspection after repair found the cervical os and cannel to be patent.   All counts x 2 were correct.  The pt was taken to the PACU in stable condition with slight blood tinged urine noted in the foley.

## 2016-07-03 NOTE — Lactation Note (Signed)
This note was copied from a baby's chart. Lactation Consultation Note  Patient Name: Boy Jake Batheouara Boshers ZHYQM'VToday's Date: 07/03/2016 Reason for consult: Initial assessment Breastfeeding consultation services and support information given to patient.  This is mom's first baby and newborn is 2412 hours old.  Baby is awake and cueing.  Assisted with positioning baby in cradle hold.  Baby latched easily and nursed well with stimulation needed.  Instructed to feed with any cue and call for assist prn.  Maternal Data Has patient been taught Hand Expression?: Yes Does the patient have breastfeeding experience prior to this delivery?: No  Feeding Feeding Type: Breast Fed  LATCH Score/Interventions Latch: Grasps breast easily, tongue down, lips flanged, rhythmical sucking.  Audible Swallowing: A few with stimulation  Type of Nipple: Everted at rest and after stimulation  Comfort (Breast/Nipple): Soft / non-tender     Hold (Positioning): Assistance needed to correctly position infant at breast and maintain latch. Intervention(s): Breastfeeding basics reviewed;Support Pillows  LATCH Score: 8  Lactation Tools Discussed/Used     Consult Status Consult Status: Follow-up Date: 07/04/16 Follow-up type: In-patient    Huston FoleyMOULDEN, Harvis Mabus S 07/03/2016, 2:56 PM

## 2016-07-03 NOTE — Anesthesia Postprocedure Evaluation (Signed)
Anesthesia Post Note  Patient: Carrie Durham  Procedure(s) Performed: Procedure(s) (LRB): repair of cervical laceration (N/A)  Patient location during evaluation: Mother Baby Anesthesia Type: Epidural Level of consciousness: awake and alert and oriented Pain management: pain level controlled Vital Signs Assessment: post-procedure vital signs reviewed and stable Respiratory status: spontaneous breathing and nonlabored ventilation Cardiovascular status: stable Postop Assessment: no headache, no signs of nausea or vomiting, no backache, epidural receding, patient able to bend at knees and adequate PO intake Anesthetic complications: no        Last Vitals:  Vitals:   07/03/16 0715 07/03/16 0815  BP: 112/62 (!) 110/56  Pulse: 79 91  Resp: 20 20  Temp: 36.8 C 36.8 C    Last Pain:  Vitals:   07/03/16 0815  TempSrc: Oral  PainSc: 4    Pain Goal: Patients Stated Pain Goal: 8 (07/01/16 2301)               Laban EmperorMalinova,Saliou Barnier Hristova

## 2016-07-04 LAB — CBC
HCT: 19.7 % — ABNORMAL LOW (ref 36.0–46.0)
Hemoglobin: 6.8 g/dL — CL (ref 12.0–15.0)
MCH: 31.6 pg (ref 26.0–34.0)
MCHC: 34.5 g/dL (ref 30.0–36.0)
MCV: 91.6 fL (ref 78.0–100.0)
PLATELETS: 138 10*3/uL — AB (ref 150–400)
RBC: 2.15 MIL/uL — ABNORMAL LOW (ref 3.87–5.11)
RDW: 14.9 % (ref 11.5–15.5)
WBC: 20.1 10*3/uL — ABNORMAL HIGH (ref 4.0–10.5)

## 2016-07-04 LAB — PREPARE RBC (CROSSMATCH)

## 2016-07-04 MED ORDER — SODIUM CHLORIDE 0.9 % IV SOLN: Freq: Once | INTRAVENOUS | Status: DC

## 2016-07-04 NOTE — Progress Notes (Signed)
Post Partum Day #1 Subjective: voiding, tolerating PO and She is dizzy when she walks, would like a blood transfusion  Objective: Blood pressure (!) 107/58, pulse (!) 101, temperature 97.7 F (36.5 C), temperature source Oral, resp. rate 18, height 5' 0.24" (1.53 m), weight 73.9 kg (163 lb), last menstrual period 09/18/2015, SpO2 98 %, unknown if currently breastfeeding.  Physical Exam:  General: alert Lochia: appropriate Uterine Fundus: firm and appropriately tender at U-1 DVT Evaluation: No evidence of DVT seen on physical exam.   Recent Labs  07/03/16 0536 07/04/16 0513  HGB 9.6* 6.8*  HCT 27.9* 19.7*    Assessment/Plan: Plan for discharge tomorrow  Transfuse today Breast and bottle   LOS: 3 days   Carrie Durham 07/04/2016, 6:46 AM

## 2016-07-04 NOTE — Progress Notes (Signed)
UR chart review completed.  

## 2016-07-04 NOTE — Plan of Care (Signed)
Problem: Life Cycle: Goal: Chance of risk for complications during the postpartum period will decrease Outcome: Completed/Met Date Met: 07/04/16 Receiving 2u PRBC

## 2016-07-05 LAB — TYPE AND SCREEN
ABO/RH(D): O POS
ANTIBODY SCREEN: NEGATIVE
UNIT DIVISION: 0
Unit division: 0

## 2016-07-05 LAB — BPAM RBC
Blood Product Expiration Date: 201806052359
Blood Product Expiration Date: 201806052359
ISSUE DATE / TIME: 201805211151
ISSUE DATE / TIME: 201805210841
UNIT TYPE AND RH: 5100
Unit Type and Rh: 5100

## 2016-07-05 MED ORDER — IBUPROFEN 600 MG PO TABS: 600.0000 mg | ORAL_TABLET | Freq: Four times a day (QID) | ORAL | 0 refills | Status: DC

## 2016-07-05 NOTE — Discharge Summary (Signed)
OB Discharge Summary  Patient Name: Carrie Durham DOB: 08/29/1980 MRN: [id number]  Date of admission: 07/01/2016 Delivering MD: Hermina StaggersERVIN, MICHAEL L   Date of discharge: 07/05/2016  Admitting diagnosis: 41 wks bleeding induction Cervical Tear Intrauterine pregnancy: 1008w2d     Secondary diagnosis:Active Problems:   Post term pregnancy at [redacted] weeks gestation   Postpartum care following vaginal delivery      Discharge diagnosis: Term Pregnancy Delivered                                                                     Post partum procedures:repair of cervical laceration in the OR immediately postpartum  Augmentation: AROM, Pitocin, Cytotec and Foley Balloon  Complications: None  Hospital course:  Induction of Labor With Vaginal Delivery   36 y.o. yo G2P1011 at 508w2d was admitted to the hospital 07/01/2016 for induction of labor.  Indication for induction: Postdates and and bleeding.  Patient had an uncomplicated labor course as follows: Membrane Rupture Time/Date: 3:45 PM ,07/02/2016   Intrapartum Procedures: Episiotomy: Median [2]                                         Lacerations:  3rd degree [4];Cervical [7]  Patient had delivery of a Viable infant.  Information for the patient's newborn:  Clerance LavBenamrane, Boy Sherrell [[id number]][[id number]]  Delivery Method: Vaginal, Vacuum (Extractor) (Filed from Delivery Summary)   07/03/2016  Details of delivery can be found in separate delivery note.  Patient had a routine postpartum course. Patient is discharged home 07/05/16.  Physical exam  Vitals:   07/04/16 1225 07/04/16 1415 07/04/16 1722 07/05/16 0600  BP: (!) 98/54 108/62 108/61 (!) 101/57  Pulse: 89 87 83 79  Resp: 18 18 18 18   Temp: 98.1 F (36.7 C) 98.3 F (36.8 C) 98.1 F (36.7 C) 98.3 F (36.8 C)  TempSrc: Oral Oral Oral Oral  SpO2: 99% 99%    Weight:      Height:       General: alert Lochia: appropriate Uterine Fundus: firm Incision: N/A DVT Evaluation: No evidence of DVT  seen on physical exam. Labs: Lab Results  Component Value Date   WBC 20.1 (H) 07/04/2016   HGB 6.8 (LL) 07/04/2016   HCT 19.7 (L) 07/04/2016   MCV 91.6 07/04/2016   PLT 138 (L) 07/04/2016   CMP Latest Ref Rng & Units 01/01/2016  Glucose 65 - 99 mg/dL 81  BUN 6 - 20 mg/dL -  Creatinine 4.010.44 - 0.271.00 mg/dL -  Sodium 253135 - 664145 mmol/L -  Potassium 3.5 - 5.1 mmol/L -  Chloride 101 - 111 mmol/L -  CO2 22 - 32 mmol/L -  Calcium 8.9 - 10.3 mg/dL -  Total Protein 6.5 - 8.1 g/dL -  Total Bilirubin 0.3 - 1.2 mg/dL -  Alkaline Phos 38 - 403126 U/L -  AST 15 - 41 U/L -  ALT 14 - 54 U/L -    Discharge instruction: per After Visit Summary and "Baby and Me Booklet".  After Visit Meds:  Allergies as of 07/05/2016   No Known Allergies     Medication List  STOP taking these medications   ondansetron 4 MG tablet Commonly known as:  ZOFRAN     TAKE these medications   CONCEPT DHA 53.5-38-1 MG Caps Take 1 capsule by mouth daily.   ferrous sulfate 325 (65 FE) MG tablet Commonly known as:  FERROUSUL Take 1 tablet (325 mg total) by mouth daily with breakfast.   hydrocortisone-pramoxine rectal foam Commonly known as:  PROCTOFOAM HC Place 1 applicator rectally 2 (two) times daily. What changed:  when to take this  reasons to take this   ibuprofen 600 MG tablet Commonly known as:  ADVIL,MOTRIN Take 1 tablet (600 mg total) by mouth every 6 (six) hours.   pantoprazole 20 MG tablet Commonly known as:  PROTONIX Take 1 tablet (20 mg total) by mouth daily.       Diet: routine diet  Activity: Advance as tolerated. Pelvic rest for 6 weeks.   Outpatient follow ZO:XWRUEA and bottle Follow up Appt:No future appointments. Follow up visit: No Follow-up on file.  Postpartum contraception: 4 weeks  Newborn Data: Live born female  Birth Weight: 9 lb 14.7 oz (4499 g) APGAR: 1, 8  Baby Feeding: breast and bottle Disposition:home with mother   07/05/2016 Allie Bossier, MD

## 2016-07-05 NOTE — Lactation Note (Addendum)
This note was copied from a baby's chart. Lactation Consultation Note  Patient Name: Boy Jake Batheouara Furgeson UJWJX'BToday's Date: 07/05/2016 Reason for consult: Follow-up assessment;Infant weight loss;Other (Comment) (3% weight loss/ Breast/ Formula Arabic interpreter - Zina [phone] )  Baby is 458 hours old and has been breast and formula.  LC updated doc flow sheets per mom per interpreter.  LC reviewed supply and demand and the importance of giving the baby practice at the breast so her milk will come in .  Instructed mom on the use of the hand pump. For the left breast the #24 flange a good fit.  On the right #24 to tight and #27 provided for when the milk comes in.  Sore nipple and engorgement prevention and tx reviewed.  Per dad active with WIC.  Mother informed of post-discharge support and given phone number to the lactation department, including services for phone call assistance; out-patient appointments; and breastfeeding support group. List of other breastfeeding resources in the community given in the handout. Encouraged mother to call for problems or concerns related to breastfeeding. Baby presently asleep , unable to see a latch.   Maternal Data Has patient been taught Hand Expression?: Yes (several drops from both breast )  Feeding Feeding Type:  (baby sleeping reasonly fed )  LATCH Score/Interventions                Intervention(s): Breastfeeding basics reviewed     Lactation Tools Discussed/Used Tools: Pump;Flanges Flange Size: 27 Breast pump type: Manual WIC Program: Yes (Active with WIC ) Pump Review: Setup, frequency, and cleaning Initiated by:: MAI  Date initiated:: 07/05/16   Consult Status Consult Status: Complete Date: 07/05/16    Matilde SprangMargaret Ann Dorthia Tout 07/05/2016, 12:00 PM

## 2016-07-05 NOTE — Plan of Care (Signed)
Problem: Education: Goal: Knowledge of condition will improve Discharge education reviewed with patient and significant other. Ipad interpreter Ammar [phone] used at standby; however, patient understood all education with no assistance from interpreter.

## 2016-07-05 NOTE — Discharge Instructions (Signed)

## 2016-07-07 NOTE — Addendum Note (Signed)
Addendum  created 07/07/16 1215 by Phillips Groutarignan, Joelyn Lover, MD   Anesthesia Event edited

## 2016-08-15 ENCOUNTER — Ambulatory Visit (INDEPENDENT_AMBULATORY_CARE_PROVIDER_SITE_OTHER): Payer: Medicaid Other | Admitting: Obstetrics & Gynecology

## 2016-08-15 ENCOUNTER — Encounter: Payer: Self-pay | Admitting: Obstetrics & Gynecology

## 2016-08-15 NOTE — Progress Notes (Signed)
Post Partum Exam  Carrie Durham is a 36 y.o. 612P1011 female who presents for a postpartum visit. She is 6 weeks postpartum following a spontaneous vaginal delivery. I have fully reviewed the prenatal and intrapartum course. The delivery was at 41 gestational weeks.  Anesthesia: epidural. Postpartum course has been unremarkable. Baby's course has been unremarkable. Baby is feeding by breast and formula. Bleeding no bleeding. Bowel function is normal. Bladder function is normal. Patient is not sexually active. Contraception method is undecided. Postpartum depression screening:neg  The following portions of the patient's history were reviewed and updated as appropriate: allergies, current medications, past family history, past medical history, past social history, past surgical history and problem list.  Review of Systems Pertinent items are noted in HPI.    Objective:  Blood pressure (!) 96/56, pulse 71, resp. rate 16, height 5\' 4"  (1.626 m), weight 140 lb (63.5 kg), currently breastfeeding.  General:  alert   Breasts:  inspection negative, no nipple discharge or bleeding, no masses or nodularity palpable  Lungs: clear to auscultation bilaterally  Heart:  regular rate and rhythm, S1, S2 normal, no murmur, click, rub or gallop  Abdomen: soft, non-tender; bowel sounds normal; no masses,  no organomegaly   Vulva:  normal  Vagina: not evaluated  Cervix:  anteverted  Corpus: normal  Adnexa:  no mass, fullness, tenderness  Rectal Exam: Not performed.        Assessment:    Normal postpartum exam. Pap smear not done at today's visit.   Plan:   1. Contraception: condoms 2. She will call if she wants POPs. She would like another baby in a year or so. 3. Follow up in: 1 year or as needed.

## 2016-08-25 NOTE — Anesthesia Postprocedure Evaluation (Signed)
Anesthesia Post Note  Patient: Carrie Durham  Procedure(s) Performed: Procedure(s) (LRB): repair of cervical laceration (N/A)     Anesthesia Post Evaluation  Last Vitals:  Vitals:   07/04/16 1722 07/05/16 0600  BP: 108/61 (!) 101/57  Pulse: 83 79  Resp: 18 18  Temp: 36.7 C 36.8 C    Last Pain:  Vitals:   07/05/16 1144  TempSrc:   PainSc: 0-No pain                 Phillips Groutarignan, Keaira Whitehurst

## 2016-08-25 NOTE — Addendum Note (Signed)
Addendum  created 08/25/16 1443 by Sparkles Mcneely, MD   Sign clinical note    

## 2016-09-29 ENCOUNTER — Inpatient Hospital Stay (HOSPITAL_COMMUNITY)
Admission: AD | Admit: 2016-09-29 | Discharge: 2016-09-29 | Disposition: A | Discharge status: Home / Self Care | Payer: Medicaid Other | Source: Ambulatory Visit | Attending: Family Medicine | Admitting: Family Medicine

## 2016-09-29 ENCOUNTER — Encounter (HOSPITAL_COMMUNITY): Payer: Self-pay

## 2016-09-29 DIAGNOSIS — K649 Unspecified hemorrhoids: Secondary | ICD-10-CM

## 2016-09-29 DIAGNOSIS — K5900 Constipation, unspecified: Secondary | ICD-10-CM | POA: Insufficient documentation

## 2016-09-29 MED ORDER — NITROGLYCERIN 0.4 % RE OINT: 1.0000 "application " | TOPICAL_OINTMENT | Freq: Two times a day (BID) | RECTAL | 2 refills | Status: AC

## 2016-09-29 NOTE — MAU Note (Signed)
About a wk ago, started having pain with hemorrhoids.  Is using topical meds, not helping.  Pt unable to sit still. Is having bleeding from rectum, has been a problem since before delivery

## 2016-09-29 NOTE — MAU Note (Signed)
Urine in lab 

## 2016-09-29 NOTE — Discharge Instructions (Signed)
Hemorrhoids Hemorrhoids are swollen veins in and around the rectum or anus. Hemorrhoids can cause pain, itching, or bleeding. Most of the time, they do not cause serious problems. They usually get better with diet changes, lifestyle changes, and other home treatments. Follow these instructions at home: Eating and drinking  Eat foods that have fiber, such as whole grains, beans, nuts, fruits, and vegetables. Ask your doctor about taking products that have added fiber (fibersupplements).  Drink enough fluid to keep your pee (urine) clear or pale yellow. For Pain and Swelling  Take a warm-water bath (sitz bath) for 20 minutes to ease pain. Do this 3-4 times a day.  If directed, put ice on the painful area. It may be helpful to use ice between your warm baths. ? Put ice in a plastic bag. ? Place a towel between your skin and the bag. ? Leave the ice on for 20 minutes, 2-3 times a day. General instructions  Take over-the-counter and prescription medicines only as told by your doctor. ? Medicated creams and medicines that are inserted into the anus (suppositories) may be used or applied as told.  Exercise often.  Go to the bathroom when you have the urge to poop (to have a bowel movement). Do not wait.  Avoid pushing too hard (straining) when you poop.  Keep the butt area dry and clean. Use wet toilet paper or moist paper towels.  Do not sit on the toilet for a long time. Contact a doctor if:  You have any of these: ? Pain and swelling that do not get better with treatment or medicine. ? Bleeding that will not stop. ? Trouble pooping or you cannot poop. ? Pain or swelling outside the area of the hemorrhoids. This information is not intended to replace advice given to you by your health care provider. Make sure you discuss any questions you have with your health care provider. Document Released: 11/10/2007 Document Revised: 07/09/2015 Document Reviewed: 10/15/2014 Elsevier  Interactive Patient Education  2018 [employer]. Constipation, Adult Constipation is when a person has fewer bowel movements in a week than normal, has difficulty having a bowel movement, or has stools that are dry, hard, or larger than normal. Constipation may be caused by an underlying condition. It may become worse with age if a person takes certain medicines and does not take in enough fluids. Follow these instructions at home: Eating and drinking   Eat foods that have a lot of fiber, such as fresh fruits and vegetables, whole grains, and beans.  Limit foods that are high in fat, low in fiber, or overly processed, such as french fries, hamburgers, cookies, candies, and soda.  Drink enough fluid to keep your urine clear or pale yellow. General instructions  Exercise regularly or as told by your health care provider.  Go to the restroom when you have the urge to go. Do not hold it in.  Take over-the-counter and prescription medicines only as told by your health care provider. These include any fiber supplements.  Practice pelvic floor retraining exercises, such as deep breathing while relaxing the lower abdomen and pelvic floor relaxation during bowel movements.  Watch your condition for any changes.  Keep all follow-up visits as told by your health care provider. This is important. Contact a health care provider if:  You have pain that gets worse.  You have a fever.  You do not have a bowel movement after 4 days.  You vomit.  You are not hungry.  You lose weight.  You are bleeding from the anus.  You have thin, pencil-like stools. Get help right away if:  You have a fever and your symptoms suddenly get worse.  You leak stool or have blood in your stool.  Your abdomen is bloated.  You have severe pain in your abdomen.  You feel dizzy or you faint. This information is not intended to replace advice given to you by your health care provider. Make sure you  discuss any questions you have with your health care provider. Document Released: 10/30/2003 Document Revised: 08/21/2015 Document Reviewed: 07/22/2015 Elsevier Interactive Patient Education  2017 [employer] How to Take a [employer] IndustriesSitz Bath A sitz bath is a warm water bath that is taken while you are sitting down. The water should only come up to your hips and should cover your buttocks. Your health care provider may recommend a sitz bath to help you:  Clean the lower part of your body, including your genital area.  With itching.  With pain.  With sore muscles or muscles that tighten or spasm.  How to take a sitz bath Take 3-4 sitz baths per day or as told by your health care provider. 1. Partially fill a bathtub with warm water. You will only need the water to be deep enough to cover your hips and buttocks when you are sitting in it. 2. If your health care provider told you to put medicine in the water, follow the directions exactly. 3. Sit in the water and open the tub drain a little. 4. Turn on the warm water again to keep the tub at the correct level. Keep the water running constantly. 5. Soak in the water for 15-20 minutes or as told by your health care provider. 6. After the sitz bath, pat the affected area dry first. Do not rub it. 7. Be careful when you stand up after the sitz bath because you may feel dizzy.  Contact a health care provider if:  Your symptoms get worse. Do not continue with sitz baths if your symptoms get worse.  You have new symptoms. Do not continue with sitz baths until you talk with your health care provider. This information is not intended to replace advice given to you by your health care provider. Make sure you discuss any questions you have with your health care provider. Document Released: 10/24/2003 Document Revised: 07/01/2015 Document Reviewed: 01/29/2014 Elsevier Interactive Patient Education  [employer]2018 Elsevier Inc.

## 2016-09-29 NOTE — MAU Provider Note (Signed)
Patient Carrie Durham is a 36 y.o. 832P1011 Non-pregnant female here with hemorroid pain since May, 2018 after she delivered her first baby.  Patient is here with husband and infant; wife has signed consent for husband to be her [place] interpreter.  History     CSN: [id number]  Arrival date and time: 09/29/16 1538   First Provider Initiated Contact with Patient 09/29/16 1623      Chief Complaint  Patient presents with  . Hemorrhoids   HPI  Hemorroid pain begain in May 2018 while she was pregnant. Pain is a 10/10; she is unable to sit still. Nothing makes it better.  Patient was told it would resolve after pregnancy, but it has not. She has tried proctofoam and dibucaine; the dibucaine made her feel dizzy and she did not like it.   She has constipation with hard stools every two days. She also has frequently light rectal bleeding when she wipes. She has not seen any other doctor for this.  OB History    Gravida Para Term Preterm AB Living   2 1 1  0 1 1   SAB TAB Ectopic Multiple Live Births   1 0 0 0 1      Past Medical History:  Diagnosis Date  . Hemorrhoids during pregnancy in third trimester 06/10/2016  . Medical history non-contributory     Past Surgical History:  Procedure Laterality Date  . NO PAST SURGERIES    . REPAIR VAGINAL CUFF N/A 07/03/2016   Procedure: repair of cervical laceration;  Surgeon: Hermina StaggersErvin, Michael L, MD;  Location: [facility];  Service: Gynecology;  Laterality: N/A;    No family history on file.  Social History  Substance Use Topics  . Smoking status: Never Smoker  . Smokeless tobacco: Never Used  . Alcohol use No    Allergies: No Known Allergies  Prescriptions Prior to Admission  Medication Sig Dispense Refill Last Dose  . ferrous sulfate (FERROUSUL) 325 (65 FE) MG tablet Take 1 tablet (325 mg total) by mouth daily with breakfast. 60 tablet 1 Past Month at Unknown time  . ibuprofen (ADVIL,MOTRIN) 600 MG tablet Take 1 tablet (600 mg total) by  mouth every 6 (six) hours. 30 tablet 0   . pantoprazole (PROTONIX) 20 MG tablet Take 1 tablet (20 mg total) by mouth daily. 30 tablet 1 Past Month at Unknown time  . Prenat-FeFum-FePo-FA-Omega 3 (CONCEPT DHA) 53.5-38-1 MG CAPS Take 1 capsule by mouth daily.   Past Month at Unknown time    Review of Systems  HENT: Negative.   Respiratory: Negative.   Cardiovascular: Negative.   Gastrointestinal: Positive for rectal pain.  Genitourinary: Negative.   Musculoskeletal: Negative.   Neurological: Negative.    Physical Exam   Blood pressure 91/76, pulse 65, temperature 98.5 F (36.9 C), temperature source Oral, resp. rate 20, SpO2 100 %, currently breastfeeding.  Physical Exam  Constitutional: She is oriented to person, place, and time. She appears well-developed and well-nourished.  HENT:  Head: Normocephalic.  Neck: Normal range of motion.  Cardiovascular: Normal rate.   Respiratory: Effort normal.  GI: Soft.  Genitourinary:  Genitourinary Comments: 4 small hemorroids that are pea-sized are located on the anal opening. Hemorrhoids are skin-colored with no signs of bleeding or thromosis, cool, dry and tender to the touch.    Musculoskeletal: Normal range of motion.  Neurological: She is alert and oriented to person, place, and time. She has normal reflexes.  Skin: Skin is warm.    MAU Course  Procedures  MDM -Internal rectal exam not done as patient was uncomfortable. -Extensive patient teaching done in MAU regarding sitz baths, high fiber diet, hydration and preventing constipation, stool softerners, and use of nitroglycerin cream. Explained to patient the side effect of headaches, educated patient to sit up slowly and rest when she feels light headed.   Assessment and Plan   1. Hemorrhoids, unspecified hemorrhoid type    2. Extensive teaching done (see note above).  3. RX for nitroglycerin cream given 4. Patient to follow up with her GI doctor for possible colonoscopy and  follow-up.  5. Patient and husband verbalized understanding.   Charlesetta Garibaldi Kooistra 09/29/2016, 5:18 PM

## 2017-02-24 ENCOUNTER — Ambulatory Visit (INDEPENDENT_AMBULATORY_CARE_PROVIDER_SITE_OTHER): Payer: Medicaid Other | Admitting: Family Medicine

## 2017-02-24 ENCOUNTER — Encounter: Payer: Self-pay | Admitting: Family Medicine

## 2017-02-24 VITALS — BP 100/62 | HR 78 | Temp 98.7°F | Resp 14 | Ht 64.0 in | Wt 139.0 lb

## 2017-02-24 DIAGNOSIS — K649 Unspecified hemorrhoids: Secondary | ICD-10-CM | POA: Diagnosis not present

## 2017-02-24 DIAGNOSIS — R5383 Other fatigue: Secondary | ICD-10-CM | POA: Diagnosis not present

## 2017-02-24 DIAGNOSIS — Z131 Encounter for screening for diabetes mellitus: Secondary | ICD-10-CM

## 2017-02-24 DIAGNOSIS — L659 Nonscarring hair loss, unspecified: Secondary | ICD-10-CM | POA: Diagnosis not present

## 2017-02-24 LAB — POCT GLYCOSYLATED HEMOGLOBIN (HGB A1C): Hemoglobin A1C: 5.1

## 2017-02-24 LAB — POCT URINALYSIS DIP (DEVICE)
BILIRUBIN URINE: NEGATIVE
GLUCOSE, UA: NEGATIVE mg/dL
KETONES UR: NEGATIVE mg/dL
LEUKOCYTES UA: NEGATIVE
Nitrite: NEGATIVE
Protein, ur: NEGATIVE mg/dL
Urobilinogen, UA: 0.2 mg/dL (ref 0.0–1.0)
pH: 5.5 (ref 5.0–8.0)

## 2017-02-24 LAB — POCT URINE PREGNANCY: Preg Test, Ur: NEGATIVE

## 2017-02-24 MED ORDER — HYDROCORTISONE ACETATE 25 MG RE SUPP: 25.0000 mg | Freq: Two times a day (BID) | RECTAL | 3 refills | Status: AC

## 2017-02-24 MED ORDER — HYDROCORTISONE 2.5 % RE CREA: 1.0000 "application " | TOPICAL_CREAM | Freq: Two times a day (BID) | RECTAL | 3 refills | Status: DC

## 2017-02-24 MED ORDER — HYDROCORTISONE 2.5 % RE CREA: 1.0000 "application " | TOPICAL_CREAM | Freq: Two times a day (BID) | RECTAL | 3 refills | Status: AC

## 2017-02-24 NOTE — Patient Instructions (Addendum)
You will be notified by phone of lab results. If an earlier follow-up is warranted we will contact you to schedule an appointment. Otherwise I will see you back in 6 months for a complete annual physical    Fatigue Fatigue is feeling tired all of the time, a lack of energy, or a lack of motivation. Occasional or mild fatigue is often a normal response to activity or life in general. However, long-lasting (chronic) or extreme fatigue may indicate an underlying medical condition. Follow these instructions at home: Watch your fatigue for any changes. The following actions may help to lessen any discomfort you are feeling:  Talk to your health care provider about how much sleep you need each night. Try to get the required amount every night.  Take medicines only as directed by your health care provider.  Eat a healthy and nutritious diet. Ask your health care provider if you need help changing your diet.  Drink enough fluid to keep your urine clear or pale yellow.  Practice ways of relaxing, such as yoga, meditation, massage therapy, or acupuncture.  Exercise regularly.  Change situations that cause you stress. Try to keep your work and personal routine reasonable.  Do not abuse illegal drugs.  Limit alcohol intake to no more than 1 drink per day for nonpregnant women and 2 drinks per day for men. One drink equals 12 ounces of beer, 5 ounces of wine, or 1 ounces of hard liquor.  Take a multivitamin, if directed by your health care provider.  Contact a health care provider if:  Your fatigue does not get better.  You have a fever.  You have unintentional weight loss or gain.  You have headaches.  You have difficulty: ? Falling asleep. ? Sleeping throughout the night.  You feel angry, guilty, anxious, or sad.  You are unable to have a bowel movement (constipation).  You skin is dry.  Your legs or another part of your body is swollen. Get help right away if:  You feel  confused.  Your vision is blurry.  You feel faint or pass out.  You have a severe headache.  You have severe abdominal, pelvic, or back pain.  You have chest pain, shortness of breath, or an irregular or fast heartbeat.  You are unable to urinate or you urinate less than normal.  You develop abnormal bleeding, such as bleeding from the rectum, vagina, nose, lungs, or nipples.  You vomit blood.  You have thoughts about harming yourself or committing suicide.  You are worried that you might harm someone else. This information is not intended to replace advice given to you by your health care provider. Make sure you discuss any questions you have with your health care provider. Document Released: 11/28/2006 Document Revised: 07/09/2015 Document Reviewed: 06/04/2013 Elsevier Interactive Patient Education  2018 [employer]  Constipation, Adult Constipation is when a person:  Poops (has a bowel movement) fewer times in a week than normal.  Has a hard time pooping.  Has poop that is dry, hard, or bigger than normal.  Follow these instructions at home: Eating and drinking   Eat foods that have a lot of fiber, such as: ? Fresh fruits and vegetables. ? Whole grains. ? Beans.  Eat less of foods that are high in fat, low in fiber, or overly processed, such as: ? [place] fries. ? Hamburgers. ? Cookies. ? Candy. ? Soda.  Drink enough fluid to keep your pee (urine) clear or pale yellow. General instructions  Exercise regularly or as told by your doctor.  Go to the restroom when you feel like you need to poop. Do not hold it in.  Take over-the-counter and prescription medicines only as told by your doctor. These include any fiber supplements.  Do pelvic floor retraining exercises, such as: ? Doing deep breathing while relaxing your lower belly (abdomen). ? Relaxing your pelvic floor while pooping.  Watch your condition for any changes.  Keep all follow-up visits as  told by your doctor. This is important. Contact a doctor if:  You have pain that gets worse.  You have a fever.  You have not pooped for 4 days.  You throw up (vomit).  You are not hungry.  You lose weight.  You are bleeding from the anus.  You have thin, pencil-like poop (stool). Get help right away if:  You have a fever, and your symptoms suddenly get worse.  You leak poop or have blood in your poop.  Your belly feels hard or bigger than normal (is bloated).  You have very bad belly pain.  You feel dizzy or you faint. This information is not intended to replace advice given to you by your health care provider. Make sure you discuss any questions you have with your health care provider. Document Released: 07/20/2007 Document Revised: 08/21/2015 Document Reviewed: 07/22/2015 Elsevier Interactive Patient Education  2018 Elsevier Inc.      Hydrocortisone suppositories What is this medicine? HYDROCORTISONE (hye droe KOR ti sone) is a corticosteroid. It is used to decrease swelling, itching, and pain that is caused by minor skin irritations or by hemorrhoids. This medicine may be used for other purposes; ask your health care provider or pharmacist if you have questions. COMMON BRAND NAME(S): Anucort-HC, Anumed-HC, Anusol HC, Encort, GRx HiCort, Hemmorex-HC, Hemorrhoidal-HC, Hemril, Proctocort, Proctosert HC, Proctosol-HC, Rectacort HC, Rectasol-HC What should I tell my health care provider before I take this medicine? They need to know if you have any of these conditions: -an unusual or allergic reaction to hydrocortisone, corticosteroids, other medicines, foods, dyes, or preservatives -pregnant or trying to get pregnant -breast-feeding How should I use this medicine? This medicine is for rectal use only. Do not take by mouth. Wash your hands before and after use. Take off the foil wrapping. Wet the tip of the suppository with cold tap water to make it easier to use. Lie  on your side with your lower leg straightened out and your upper leg bent forward toward your stomach. Lift upper buttock to expose the rectal area. Apply gentle pressure to insert the suppository completely into the rectum, pointed end first. Hold buttocks together for a few seconds. Remain lying down for about 15 minutes to avoid having the suppository come out. Do not use more often than directed. Talk to your pediatrician regarding the use of this medicine in children. Special care may be needed. Overdosage: If you think you have taken too much of this medicine contact a poison control center or emergency room at once. NOTE: This medicine is only for you. Do not share this medicine with others. What if I miss a dose? If you miss a dose, use it as soon as you can. If it is almost time for your next dose, use only that dose. Do not use double or extra doses. What may interact with this medicine? Interactions are not expected. Do not use any other rectal products on the affected area without telling your doctor or health care professional. This list may  not describe all possible interactions. Give your health care provider a list of all the medicines, herbs, non-prescription drugs, or dietary supplements you use. Also tell them if you smoke, drink alcohol, or use illegal drugs. Some items may interact with your medicine. What should I watch for while using this medicine? Visit your doctor or health care professional for regular checks on your progress. Tell your doctor or health care professional if your symptoms do not improve after a few days of use. Do not use if there is blood in your stools. If you get any type of infection while using this medicine, you may need to stop using this medicine until our infections clears up. Ask your doctor or health care professional for advice. What side effects may I notice from receiving this medicine? Side effects that you should report to your doctor or health  care professional as soon as possible: -bloody or black, tarry stools -painful, red, pus filled blisters in hair follicles -rectal pain, burning or bleeding after use of medicine Side effects that usually do not require medical attention (report to your doctor or health care professional if they continue or are bothersome): -changes in skin color -dry skin -itching or irritation This list may not describe all possible side effects. Call your doctor for medical advice about side effects. You may report side effects to FDA at 1-800-FDA-1088. Where should I keep my medicine? Keep out of the reach of children. Store at room temperature between 20 and 25 degrees C (68 and 77 degrees F). Protect from heat and freezing. Throw away any unused medicine after the expiration date. NOTE: This sheet is a summary. It may not cover all possible information. If you have questions about this medicine, talk to your doctor, pharmacist, or health care provider.  2018 Elsevier/Gold Standard (2007-06-15 16:07:24)

## 2017-02-24 NOTE — Progress Notes (Signed)
Patient ID: Carrie Durham, female    DOB: 10/21/1980, 37 y.o.   MRN: [id number]  PCP: Bing NeighborsHarris, Edilia Ghuman S, FNP  Chief Complaint  Patient presents with  . Establish Care  . Fatigue  . hair loss    Subjective:  HPI Carrie Durham is a 37 y.o. female presents to establish care. She complains of fatigue, hair loss, and ongoing problem with hemorrhoids.  Medical history significant spontaneous vaginal delivery and term birth.  She is currently breast-feeding mother.  Reports recent 3-week history of fatigue and dizziness.  D complains of associated paresthesias of the legs bilaterally.Denies any known history of hypo-or hyperkalemia/hypo-or hypercalcemia she denies any changes in diet however endorses poor hydration and  and poor nutritional intake.  Reports thinning of hair occurring since delivery of child approximately 8 months ago.  Initially here started out as just shedding and however now reports hair is thinning at a rapid level.  She denies any areas of balding, denies use of new hair care products, and denies stress or anxiety.  Carrie Durham complains of an ongoing issue with hemorrhoids of the rectum.  She was [facility] and [facility] back in August for the similar complaint.  She was treated with a with a cream that caused significant dizziness and headaches.  She is requesting evaluation of hemorrhoids again today.  She reports occasional pain as well as small specks of blood with stools.  She denies that this occurs with every bowel movement.  She endorses a diet low in fiber and water intake.  Denies any known family history of colon cancer.  Social History   Socioeconomic History  . Marital status: Married    Spouse name: Not on file  . Number of children: Not on file  . Years of education: Not on file  . Highest education level: Not on file  Social Needs  . Financial resource strain: Not on file  . Food insecurity - worry: Not on file  . Food insecurity -  inability: Not on file  . Transportation needs - medical: Not on file  . Transportation needs - non-medical: Not on file  Occupational History  . Not on file  Tobacco Use  . Smoking status: Never Smoker  . Smokeless tobacco: Never Used  Substance and Sexual Activity  . Alcohol use: No  . Drug use: No  . Sexual activity: Yes  Other Topics Concern  . Not on file  Social History Narrative  . Not on file    Denies any known family history of cardiovascular, cancer, respiratory, or thyroid disease.  Review of Systems  Constitutional: Positive for fatigue.  HENT: Negative.   Eyes: Negative.   Respiratory: Negative.   Cardiovascular: Negative.   Gastrointestinal: Negative.        Hemorrhoids Anal discomfort  Endocrine: Negative.   Musculoskeletal: Negative.   Skin:       Hair thinning  Psychiatric/Behavioral: Negative.     Patient Active Problem List   Diagnosis Date Noted  . Postpartum care following vaginal delivery 07/03/2016  . Post term pregnancy at [redacted] weeks gestation 07/01/2016  . Hemorrhoids during pregnancy in third trimester 06/10/2016  . Back spasm 04/01/2016  . Supervision of normal pregnancy, antepartum 01/01/2016    No Known Allergies  Prior to Admission medications   Medication Sig Start Date End Date Taking? Authorizing Provider  Nitroglycerin 0.4 % OINT Place 1 application rectally 2 (two) times daily. Patient not taking: Reported on 02/24/2017 09/29/16   Crisoforo OxfordKooistra,  Charlesetta Garibaldi, CNM    Past Medical, Surgical Family and Social History reviewed and updated.    Objective:   Today's Vitals   02/24/17 0940  BP: 100/62  Pulse: 78  Resp: 14  Temp: 98.7 F (37.1 C)  TempSrc: Oral  SpO2: 99%  Weight: 139 lb (63 kg)  Height: 5\' 4"  (1.626 m)    Wt Readings from Last 3 Encounters:  02/24/17 139 lb (63 kg)  08/15/16 140 lb (63.5 kg)  07/01/16 163 lb (73.9 kg)    Physical Exam  Constitutional: She is oriented to person, place, and time. She  appears well-nourished.  HENT:  Head: Normocephalic and atraumatic.  Right Ear: External ear normal.  Left Ear: External ear normal.  Nose: Nose normal.  Mouth/Throat: Oropharynx is clear and moist.  Eyes: Conjunctivae and EOM are normal. Pupils are equal, round, and reactive to light.  Neck: Normal range of motion. Neck supple.  Cardiovascular: Normal rate, regular rhythm, normal heart sounds and intact distal pulses.  Pulmonary/Chest: Effort normal and breath sounds normal.  Abdominal: Soft. Bowel sounds are normal.  Genitourinary: Rectal exam shows guaiac negative stool.  Genitourinary Comments: External hemorrhoids noted on rectal exam.  Mildly edematous and mildly erythematous no active bleeding noted.  Musculoskeletal: She exhibits edema.  Lymphadenopathy:    She has no cervical adenopathy.  Neurological: She is alert and oriented to person, place, and time.  Skin: Skin is warm and dry.  No obvious signs of  alopecia noted on exam.  Psychiatric: Her behavior is normal. Judgment and thought content normal. Her mood appears anxious.   Assessment & Plan:  1. Fatigue, unspecified type, unknown cause. Patient is the mother of a young infant and admits to inconsistent rest/sleep patterns.  Will screen for anemia and thyroid disorder.  Patient did have some episodic anemia during her delivery.   encouraged to take time for self, and engage in some form of physical activity, whether that be walking or yoga, or engaging in other physical forms of exercise.  2. Hemorrhoids, unspecified hemorrhoid type, encouraged to increase hydration with 6-8 glasses of water, increase fiber to recommended 2 grams per day, and increase physical activity to promote intestinal gastric motility.   3. Hair loss, no obvious signs of alopecia.  Hair thinning may occur as a result of nutritional deficit.  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  Advised to follow-up with a license cosmetologist.  4. Screening for diabetes mellitus, A1C, 5.1.  Repeat A1c in 12 months as is currently normal.   Meds ordered this encounter  Medications  . hydrocortisone (ANUSOL-HC) 25 MG suppository    Sig: Place 1 suppository (25 mg total) rectally 2 (two) times daily.    Dispense:  100 suppository    Refill:  3    Order Specific Question:   Supervising Provider    Answer:   Quentin Angst [id number]  . DISCONTD: hydrocortisone (ANUSOL-HC) 2.5 % rectal cream    Sig: Place 1 application rectally 2 (two) times daily.    Dispense:  30 g    Refill:  3    Order Specific Question:   Supervising Provider    Answer:   Quentin Angst [id number]  . hydrocortisone (ANUSOL-HC) 2.5 % rectal cream    Sig: Place 1 application rectally 2 (two) times daily.    Dispense:  30 g  Refill:  3    Order Specific Question:   Supervising Provider    Answer:   Quentin Angst [id number]    1. Fatigue, unspecified type, unknown cause of continued fatigue with the exception of caring for a child under the age of 72 months.  Encourage patient to engage in physical activity with a goal of 150 minutes/week.  Encourage patient to go to bed at the exact same time nightly and to get up at the exact same time.  Encourage patient not to take any medications over-the-counter to induce sleep as these are likely very addictive. Checking a CBC, CMP, and thyroid panel to ensure that electrolyte disturbance, anemia, or renal liver function or not the underlying causes of persistent fatigue.  2. Hemorrhoids, unspecified hemorrhoid type no obvious signs of significant thrombosed hemorrhoids.  Will trial Anusol suppositories 3 times daily.  Encouraged to patient to increase intake of dietary fiber through natural foods and to increase water 6-8 glasses/day.  Will trial Anusol suppositories for relief and resolution  of hemorrhoid irritation.  3. Hair loss unknown cause of hair loss.  Patient has no visible signs of alopecia.  Encouraged to increase intake of foods rich in vitamin E to promote strengthening of hair.  Encouraged to follow-up cosmetologist.  Continues to use.  In order to be evaluated for different hair care products that may reduce the persistency of hair shedding.   4. Screening for diabetes mellitus, A1c is 5.1 which is normal.  Will not recheck for an additional 12 months.     Meds ordered this encounter  Medications  . hydrocortisone (ANUSOL-HC) 25 MG suppository    Sig: Place 1 suppository (25 mg total) rectally 2 (two) times daily.    Dispense:  100 suppository    Refill:  3    Order Specific Question:   Supervising Provider    Answer:   Quentin Angst [id number]  . DISCONTD: hydrocortisone (ANUSOL-HC) 2.5 % rectal cream    Sig: Place 1 application rectally 2 (two) times daily.    Dispense:  30 g    Refill:  3    Order Specific Question:   Supervising Provider    Answer:   Quentin Angst [id number]  . hydrocortisone (ANUSOL-HC) 2.5 % rectal cream    Sig: Place 1 application rectally 2 (two) times daily.    Dispense:  30 g    Refill:  3    Order Specific Question:   Supervising Provider    Answer:   Quentin Angst [id number]     Orders Placed This Encounter  Procedures  . CBC with Differential  . Thyroid Panel With TSH  . Comprehensive metabolic panel  . Iron, TIBC and Ferritin Panel  . POCT glycosylated hemoglobin (Hb A1C)  . POCT urine pregnancy  . POCT urinalysis dip (device)   Return for care in 6 months and he will be notified of any abnormal labs via phone.  Godfrey Pick. Tiburcio Pea, MSN, FNP-C [facility]  [address] [place], [place] [address] [phone]

## 2017-02-25 LAB — CBC WITH DIFFERENTIAL/PLATELET
Basophils Absolute: 0 10*3/uL (ref 0.0–0.2)
Basos: 0 %
EOS (ABSOLUTE): 0.1 10*3/uL (ref 0.0–0.4)
Eos: 2 %
HEMATOCRIT: 36.4 % (ref 34.0–46.6)
Hemoglobin: 11.8 g/dL (ref 11.1–15.9)
Immature Grans (Abs): 0 10*3/uL (ref 0.0–0.1)
Immature Granulocytes: 0 %
LYMPHS ABS: 1.8 10*3/uL (ref 0.7–3.1)
Lymphs: 30 %
MCH: 29.7 pg (ref 26.6–33.0)
MCHC: 32.4 g/dL (ref 31.5–35.7)
MCV: 92 fL (ref 79–97)
MONOS ABS: 0.4 10*3/uL (ref 0.1–0.9)
Monocytes: 7 %
Neutrophils Absolute: 3.7 10*3/uL (ref 1.4–7.0)
Neutrophils: 61 %
Platelets: 186 10*3/uL (ref 150–379)
RBC: 3.97 x10E6/uL (ref 3.77–5.28)
RDW: 13 % (ref 12.3–15.4)
WBC: 6.1 10*3/uL (ref 3.4–10.8)

## 2017-02-25 LAB — COMPREHENSIVE METABOLIC PANEL
ALBUMIN: 4.1 g/dL (ref 3.5–5.5)
ALK PHOS: 86 IU/L (ref 39–117)
ALT: 8 IU/L (ref 0–32)
AST: 15 IU/L (ref 0–40)
Albumin/Globulin Ratio: 1.4 (ref 1.2–2.2)
BUN / CREAT RATIO: 22 (ref 9–23)
BUN: 13 mg/dL (ref 6–20)
Bilirubin Total: 0.2 mg/dL (ref 0.0–1.2)
CALCIUM: 9.5 mg/dL (ref 8.7–10.2)
CO2: 26 mmol/L (ref 20–29)
CREATININE: 0.59 mg/dL (ref 0.57–1.00)
Chloride: 106 mmol/L (ref 96–106)
GFR, EST AFRICAN AMERICAN: 138 mL/min/{1.73_m2} (ref >59)
GFR, EST NON AFRICAN AMERICAN: 120 mL/min/{1.73_m2} (ref >59)
Globulin, Total: 3 g/dL (ref 1.5–4.5)
Glucose: 72 mg/dL (ref 65–99)
Potassium: 3.9 mmol/L (ref 3.5–5.2)
Sodium: 144 mmol/L (ref 134–144)
TOTAL PROTEIN: 7.1 g/dL (ref 6.0–8.5)

## 2017-02-25 LAB — IRON,TIBC AND FERRITIN PANEL
FERRITIN: 13 ng/mL — AB (ref 15–150)
IRON SATURATION: 16 % (ref 15–55)
Iron: 54 ug/dL (ref 27–159)
Total Iron Binding Capacity: 334 ug/dL (ref 250–450)
UIBC: 280 ug/dL (ref 131–425)

## 2017-02-25 LAB — THYROID PANEL WITH TSH
Free Thyroxine Index: 1.7 (ref 1.2–4.9)
T3 Uptake Ratio: 25 % (ref 24–39)
T4 TOTAL: 6.8 ug/dL (ref 4.5–12.0)
TSH: 1.56 u[IU]/mL (ref 0.450–4.500)

## 2017-02-27 ENCOUNTER — Telehealth: Payer: Self-pay | Admitting: Family Medicine

## 2017-02-27 NOTE — Telephone Encounter (Signed)
Contact patient to advise all of her recent labs were clinically within expected range.  As mentioned during her visit, she should drink at least 6-8 glasses of water daily, increase intake of vegetables, meat and fruits to improve energy. For hair loss, she could purchase hair products fortified with Vitamin E to strengthen hair.  Godfrey PickKimberly S. Tiburcio PeaHarris, MSN, FNP-C [facility]  [address] [place], [place] [address] [phone]

## 2017-02-27 NOTE — Telephone Encounter (Signed)
Left vm for patient to call back.

## 2017-02-28 NOTE — Telephone Encounter (Signed)
Left a vm for patient to callback 

## 2017-02-28 NOTE — Telephone Encounter (Signed)
Patient notified

## 2017-08-25 ENCOUNTER — Ambulatory Visit: Payer: Medicaid Other | Admitting: Family Medicine

## 2017-09-30 ENCOUNTER — Encounter

## 2018-02-13 IMAGING — US US MFM OB COMP +14 WKS
1 series · 14 of 28 positions shown · non-contrast
Comparison: none

[Series 1: us mfm ob comp +14 wks · 70 acquisitions, 14 frames shown]
[im 3/70]
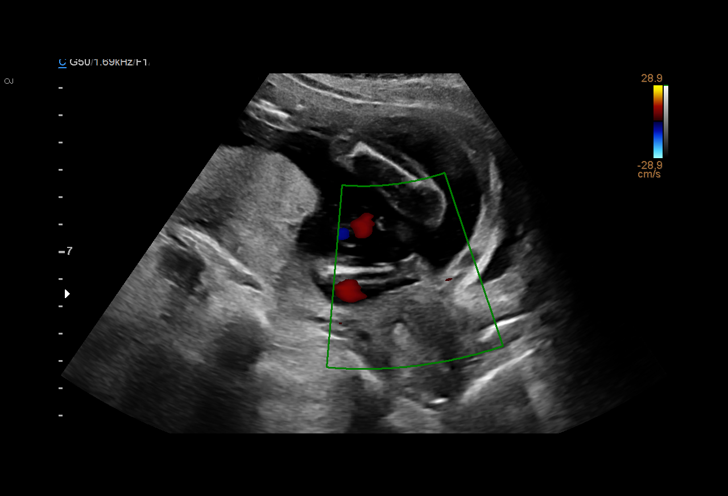
[im 8/70]
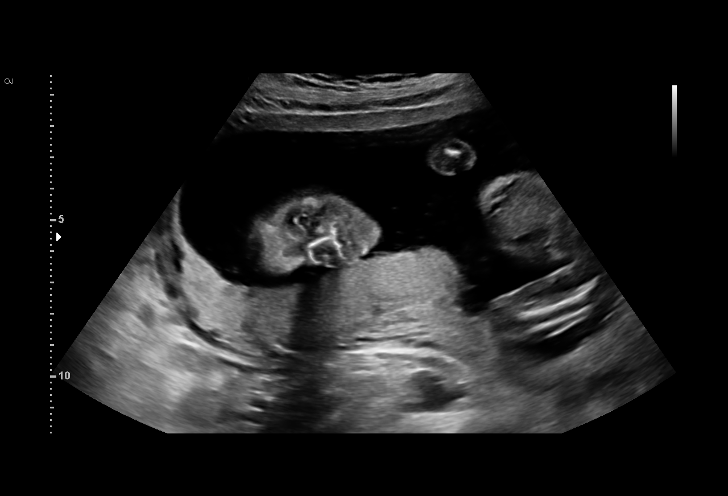
[im 13/70]
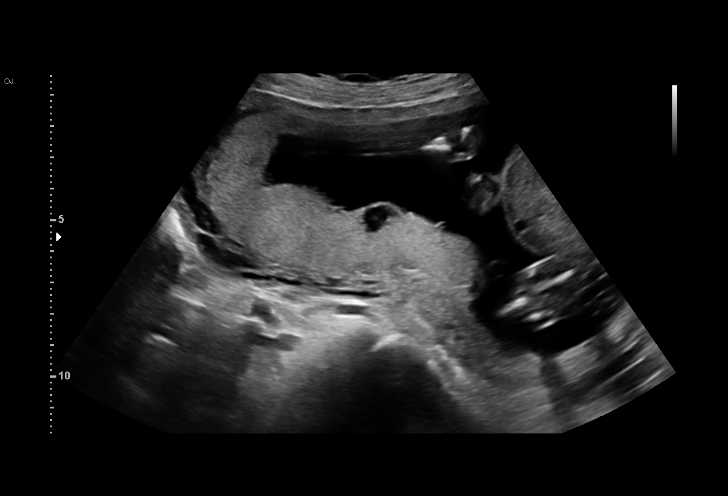
[im 18/70]
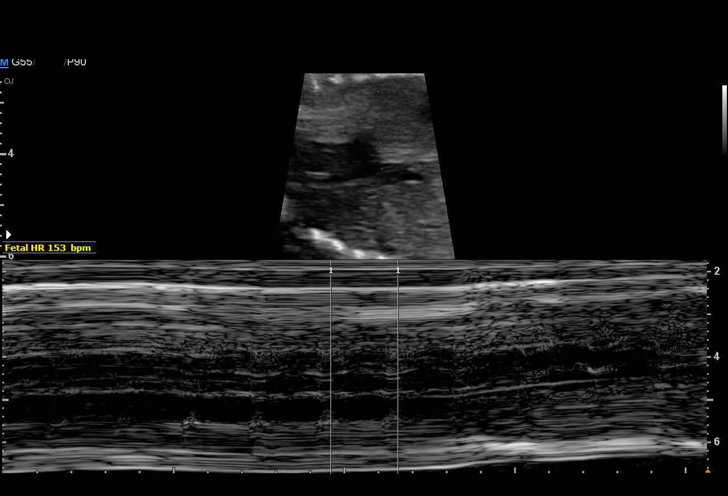
[im 24/70]
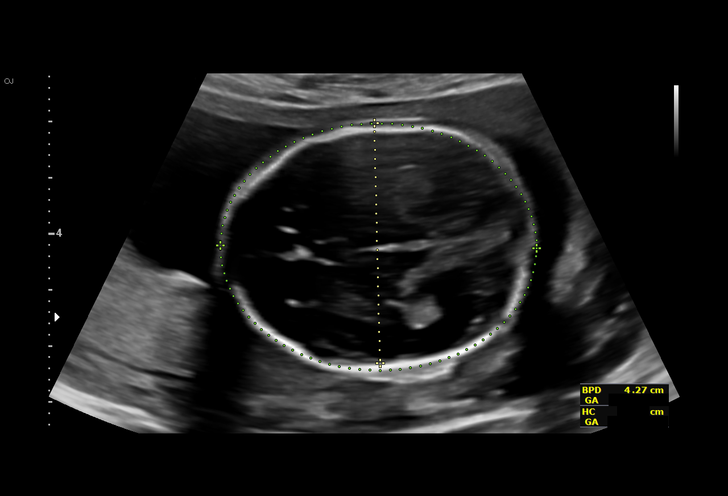
[im 29/70]
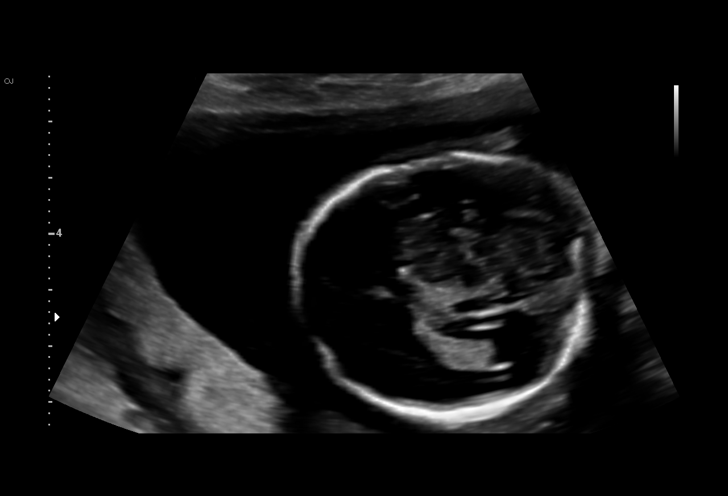
[im 34/70]
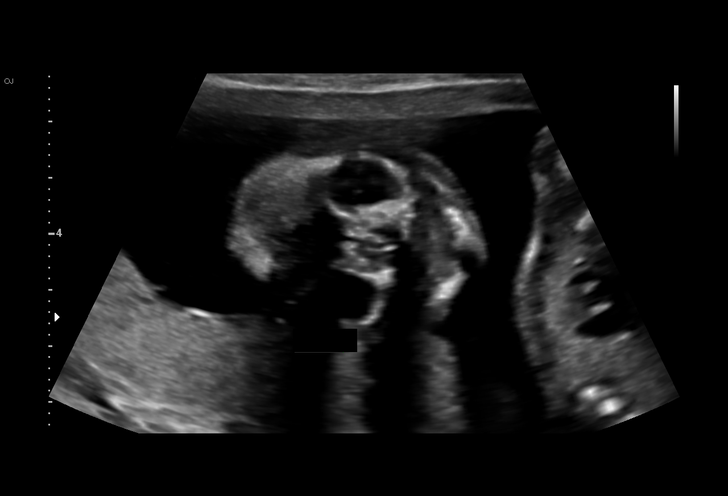
[im 39/70]
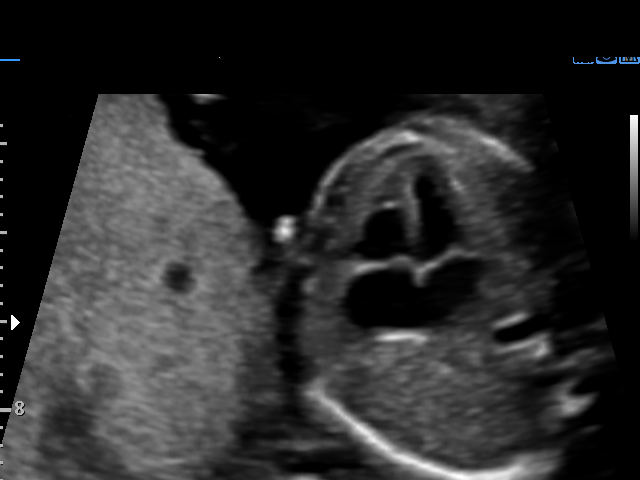
[im 44/70]
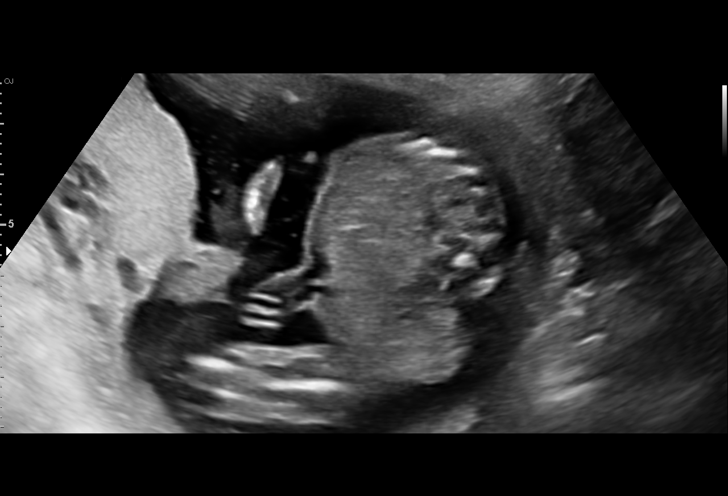
[im 49/70]
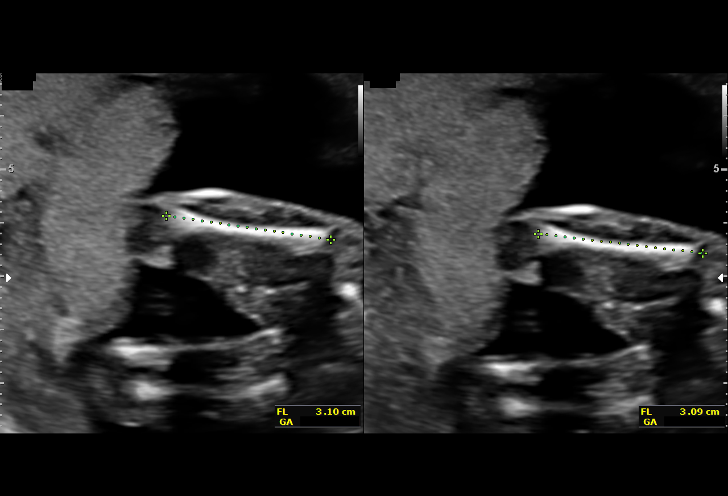
[im 54/70]
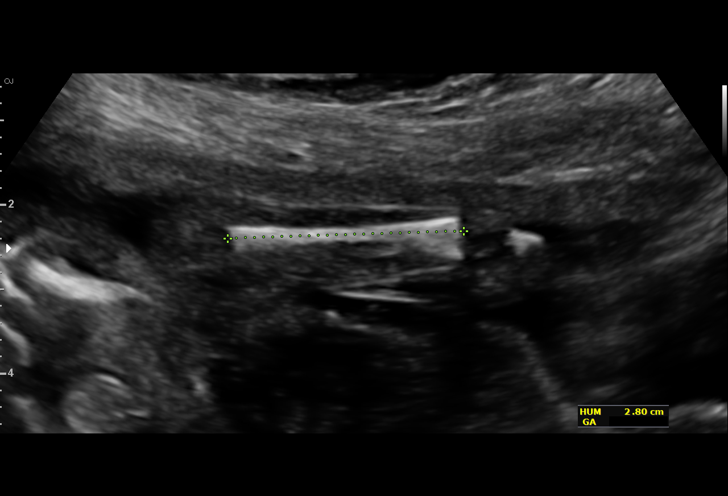
[im 59/70]
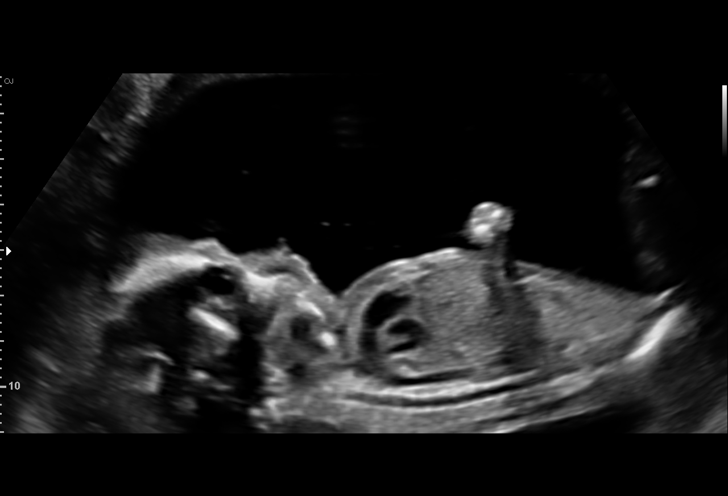
[im 64/70]
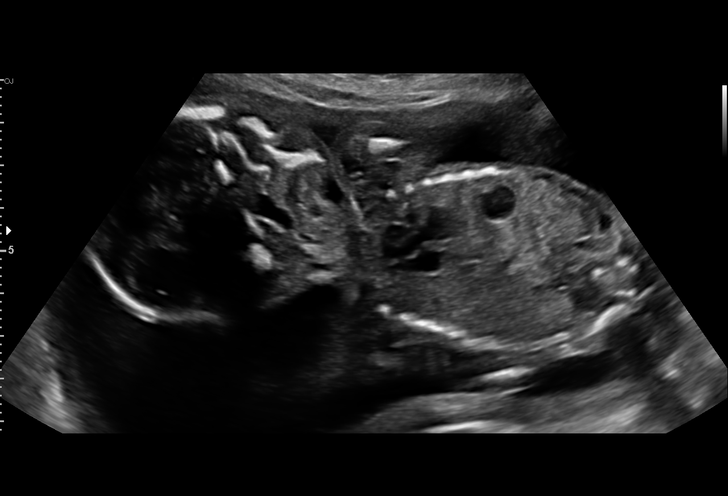
[im 70/70]
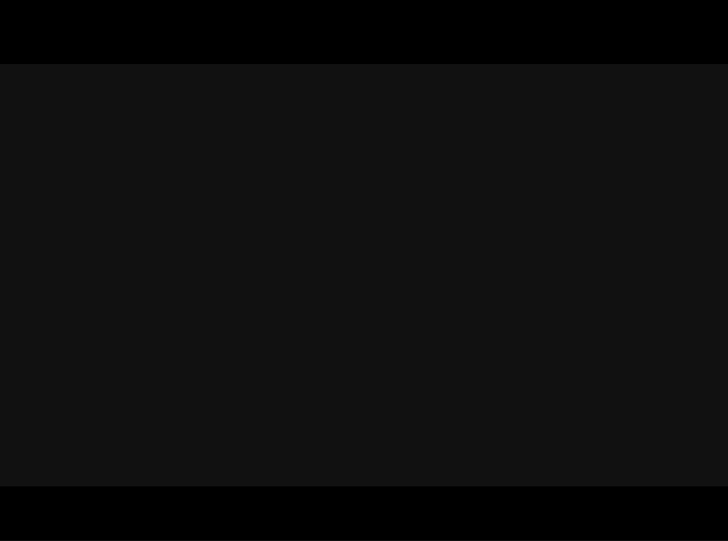

[14 of 28 positions shown; findings below may reference images not displayed]

1  AUNTYJATTY DELOWR            [id number]      [id number]     [id number]
Indications

19 weeks gestation of pregnancy
Encounter for antenatal screening for
malformations
OB History

Gravidity:    2         Term:   0        Prem:   0        SAB:   1
TOP:          0       Ectopic:  0        Living: 0
Fetal Evaluation

Num Of Fetuses:     1
Fetal Heart         153
Rate(bpm):
Cardiac Activity:   Observed
Presentation:       Breech
Placenta:           Posterior, above cervical os
P. Cord Insertion:  Visualized

Amniotic Fluid
AFI FV:      Subjectively within normal limits

Largest Pocket(cm)
4.0
Biometry

BPD:      42.9  mm     G. Age:  19w 0d         50  %    CI:        74.22   %    70 - 86
FL/HC:      19.8   %    16.1 -
HC:      158.1  mm     G. Age:  18w 5d         28  %    HC/AC:      1.03        1.09 -
AC:      153.8  mm     G. Age:  20w 4d         90  %    FL/BPD:     73.0   %
FL:       31.3  mm     G. Age:  19w 5d         69  %    FL/AC:      20.4   %    20 - 24
HUM:      28.8  mm     G. Age:  19w 2d         59  %
CER:      19.9  mm     G. Age:  19w 0d         49  %
NFT:       4.6  mm
CM:        5.1  mm

Est. FW:     325  gm    0 lb 11 oz      59  %
Gestational Age

LMP:           19w 0d        Date:  09/18/15                 EDD:   06/24/16
U/S Today:     19w 4d                                        EDD:   06/20/16
Best:          19w 0d     Det. By:  LMP  (09/18/15)          EDD:   06/24/16
Anatomy

Cranium:               Appears normal         Aortic Arch:            Appears normal
Cavum:                 Appears normal         Ductal Arch:            Appears normal
Ventricles:            Appears normal         Diaphragm:              Appears normal
Choroid Plexus:        Appears normal         Stomach:                Appears normal, left
sided
Cerebellum:            Appears normal         Abdomen:                Appears normal
Posterior Fossa:       Appears normal         Abdominal Wall:         Appears nml (cord
insert, abd wall)
Nuchal Fold:           Appears normal         Cord Vessels:           Appears normal (3
vessel cord)
Face:                  Appears normal         Kidneys:                Appear normal
(orbits and profile)
Lips:                  Appears normal         Bladder:                Appears normal
Thoracic:              Appears normal         Spine:                  Appears normal
Heart:                 Appears normal         Upper Extremities:      Appears normal
(4CH, axis, and situs
RVOT:                  Appears normal         Lower Extremities:      Appears normal
LVOT:                  Appears normal

Other:  Male gender. Heels and 5th digit visualized.
Cervix Uterus Adnexa

Cervix
Length:            3.4  cm.
Normal appearance by transabdominal scan.

Uterus
No abnormality visualized.

Left Ovary
Within normal limits.

Right Ovary
Within normal limits.
Impression

Singleton intrauterine pregnancy at19+0 weeks
Review of the anatomy shows no sonographic markers for
aneuploidy or structural anomalies
Amniotic fluid volume is normal
Estimated fetal weight is 325g which is growth in the 59th
percentile
Recommendations

All relevant anatomy is visualized today, and growth is good.
Follow-up ultrasounds as clinically indicated.
